# Patient Record
Sex: Male | Born: 1935 | Race: White | Hispanic: No | Marital: Married | Smoking: Never smoker
Health system: Southern US, Community
[De-identification: ages and names within clinical notes are randomized; demographics above are authoritative.]

## PROBLEM LIST (undated history)

## (undated) DIAGNOSIS — I252 Old myocardial infarction: Secondary | ICD-10-CM

## (undated) DIAGNOSIS — E785 Hyperlipidemia, unspecified: Secondary | ICD-10-CM

## (undated) DIAGNOSIS — L853 Xerosis cutis: Secondary | ICD-10-CM

## (undated) DIAGNOSIS — Z8719 Personal history of other diseases of the digestive system: Secondary | ICD-10-CM

## (undated) DIAGNOSIS — G309 Alzheimer's disease, unspecified: Secondary | ICD-10-CM

## (undated) DIAGNOSIS — F32A Depression, unspecified: Secondary | ICD-10-CM

## (undated) DIAGNOSIS — E1140 Type 2 diabetes mellitus with diabetic neuropathy, unspecified: Secondary | ICD-10-CM

## (undated) DIAGNOSIS — N35919 Unspecified urethral stricture, male, unspecified site: Secondary | ICD-10-CM

## (undated) DIAGNOSIS — I700 Atherosclerosis of aorta: Secondary | ICD-10-CM

## (undated) DIAGNOSIS — N183 Chronic kidney disease, stage 3 unspecified: Secondary | ICD-10-CM

## (undated) DIAGNOSIS — Z8546 Personal history of malignant neoplasm of prostate: Secondary | ICD-10-CM

## (undated) DIAGNOSIS — I517 Cardiomegaly: Secondary | ICD-10-CM

## (undated) DIAGNOSIS — N189 Chronic kidney disease, unspecified: Secondary | ICD-10-CM

## (undated) DIAGNOSIS — I5023 Acute on chronic systolic (congestive) heart failure: Secondary | ICD-10-CM

## (undated) DIAGNOSIS — L03119 Cellulitis of unspecified part of limb: Secondary | ICD-10-CM

## (undated) DIAGNOSIS — J449 Chronic obstructive pulmonary disease, unspecified: Secondary | ICD-10-CM

## (undated) DIAGNOSIS — IMO0001 Reserved for inherently not codable concepts without codable children: Secondary | ICD-10-CM

## (undated) DIAGNOSIS — N184 Chronic kidney disease, stage 4 (severe): Secondary | ICD-10-CM

## (undated) DIAGNOSIS — G8929 Other chronic pain: Secondary | ICD-10-CM

## (undated) DIAGNOSIS — K219 Gastro-esophageal reflux disease without esophagitis: Secondary | ICD-10-CM

## (undated) DIAGNOSIS — S2243XA Multiple fractures of ribs, bilateral, initial encounter for closed fracture: Secondary | ICD-10-CM

## (undated) DIAGNOSIS — N322 Vesical fistula, not elsewhere classified: Secondary | ICD-10-CM

## (undated) DIAGNOSIS — R269 Unspecified abnormalities of gait and mobility: Secondary | ICD-10-CM

## (undated) DIAGNOSIS — E1142 Type 2 diabetes mellitus with diabetic polyneuropathy: Secondary | ICD-10-CM

## (undated) DIAGNOSIS — F329 Major depressive disorder, single episode, unspecified: Secondary | ICD-10-CM

## (undated) DIAGNOSIS — J479 Bronchiectasis, uncomplicated: Secondary | ICD-10-CM

## (undated) DIAGNOSIS — I10 Essential (primary) hypertension: Secondary | ICD-10-CM

## (undated) DIAGNOSIS — Z8679 Personal history of other diseases of the circulatory system: Secondary | ICD-10-CM

## (undated) DIAGNOSIS — Z955 Presence of coronary angioplasty implant and graft: Secondary | ICD-10-CM

## (undated) DIAGNOSIS — R0600 Dyspnea, unspecified: Secondary | ICD-10-CM

## (undated) DIAGNOSIS — D631 Anemia in chronic kidney disease: Secondary | ICD-10-CM

## (undated) DIAGNOSIS — I6529 Occlusion and stenosis of unspecified carotid artery: Secondary | ICD-10-CM

## (undated) DIAGNOSIS — M459 Ankylosing spondylitis of unspecified sites in spine: Secondary | ICD-10-CM

## (undated) DIAGNOSIS — I255 Ischemic cardiomyopathy: Secondary | ICD-10-CM

## (undated) DIAGNOSIS — I5020 Unspecified systolic (congestive) heart failure: Secondary | ICD-10-CM

## (undated) DIAGNOSIS — Z950 Presence of cardiac pacemaker: Secondary | ICD-10-CM

## (undated) DIAGNOSIS — R32 Unspecified urinary incontinence: Secondary | ICD-10-CM

## (undated) DIAGNOSIS — R490 Dysphonia: Secondary | ICD-10-CM

## (undated) DIAGNOSIS — M1990 Unspecified osteoarthritis, unspecified site: Secondary | ICD-10-CM

## (undated) DIAGNOSIS — H9190 Unspecified hearing loss, unspecified ear: Secondary | ICD-10-CM

## (undated) DIAGNOSIS — I2510 Atherosclerotic heart disease of native coronary artery without angina pectoris: Secondary | ICD-10-CM

## (undated) DIAGNOSIS — M549 Dorsalgia, unspecified: Secondary | ICD-10-CM

## (undated) DIAGNOSIS — G5600 Carpal tunnel syndrome, unspecified upper limb: Secondary | ICD-10-CM

## (undated) DIAGNOSIS — I480 Paroxysmal atrial fibrillation: Secondary | ICD-10-CM

## (undated) DIAGNOSIS — F0280 Dementia in other diseases classified elsewhere without behavioral disturbance: Secondary | ICD-10-CM

## (undated) HISTORY — DX: Anemia in chronic kidney disease: D63.1

## (undated) HISTORY — PX: TONSILLECTOMY AND ADENOIDECTOMY: SUR1326

## (undated) HISTORY — DX: Ischemic cardiomyopathy: I25.5

## (undated) HISTORY — DX: Atherosclerosis of aorta: I70.0

## (undated) HISTORY — DX: Depression, unspecified: F32.A

## (undated) HISTORY — DX: Bronchiectasis, uncomplicated: J47.9

## (undated) HISTORY — DX: Ankylosing spondylitis of unspecified sites in spine: M45.9

## (undated) HISTORY — DX: Cellulitis of unspecified part of limb: L03.119

## (undated) HISTORY — DX: Alzheimer's disease, unspecified: G30.9

## (undated) HISTORY — DX: Cardiomegaly: I51.7

## (undated) HISTORY — DX: Major depressive disorder, single episode, unspecified: F32.9

## (undated) HISTORY — DX: Multiple fractures of ribs, bilateral, initial encounter for closed fracture: S22.43XA

## (undated) HISTORY — DX: Dyspnea, unspecified: R06.00

## (undated) HISTORY — DX: Dementia in other diseases classified elsewhere without behavioral disturbance: F02.80

## (undated) HISTORY — DX: Vesical fistula, not elsewhere classified: N32.2

## (undated) HISTORY — DX: Type 2 diabetes mellitus with diabetic neuropathy, unspecified: E11.40

## (undated) HISTORY — DX: Chronic obstructive pulmonary disease, unspecified: J44.9

## (undated) HISTORY — PX: LUMBAR DISC SURGERY: SHX700

## (undated) HISTORY — DX: Chronic kidney disease, stage 4 (severe): N18.4

## (undated) HISTORY — PX: PILONIDAL CYST EXCISION: SHX744

## (undated) HISTORY — DX: Acute on chronic systolic (congestive) heart failure: I50.23

## (undated) HISTORY — DX: Chronic kidney disease, unspecified: N18.9

## (undated) HISTORY — PX: NEUROPLASTY / TRANSPOSITION MEDIAN NERVE AT CARPAL TUNNEL BILATERAL: SUR894

## (undated) HISTORY — DX: Unspecified systolic (congestive) heart failure: I50.20

## (undated) HISTORY — DX: Carpal tunnel syndrome, unspecified upper limb: G56.00

## (undated) HISTORY — DX: Dysphonia: R49.0

---

## 1993-02-02 DIAGNOSIS — Z8546 Personal history of malignant neoplasm of prostate: Secondary | ICD-10-CM

## 1993-02-02 HISTORY — DX: Personal history of malignant neoplasm of prostate: Z85.46

## 1993-02-02 HISTORY — PX: PROSTATECTOMY: SHX69

## 1998-02-02 DIAGNOSIS — Z950 Presence of cardiac pacemaker: Secondary | ICD-10-CM

## 1998-02-02 DIAGNOSIS — I252 Old myocardial infarction: Secondary | ICD-10-CM

## 1998-02-02 HISTORY — DX: Presence of cardiac pacemaker: Z95.0

## 1998-02-02 HISTORY — DX: Old myocardial infarction: I25.2

## 1998-02-02 HISTORY — PX: CORONARY ANGIOPLASTY WITH STENT PLACEMENT: SHX49

## 2006-02-02 DIAGNOSIS — S2243XA Multiple fractures of ribs, bilateral, initial encounter for closed fracture: Secondary | ICD-10-CM

## 2006-02-02 HISTORY — DX: Multiple fractures of ribs, bilateral, initial encounter for closed fracture: S22.43XA

## 2008-02-03 DIAGNOSIS — N322 Vesical fistula, not elsewhere classified: Secondary | ICD-10-CM

## 2008-02-03 DIAGNOSIS — F0280 Dementia in other diseases classified elsewhere without behavioral disturbance: Secondary | ICD-10-CM

## 2008-02-03 HISTORY — DX: Vesical fistula, not elsewhere classified: N32.2

## 2008-02-03 HISTORY — DX: Dementia in other diseases classified elsewhere, unspecified severity, without behavioral disturbance, psychotic disturbance, mood disturbance, and anxiety: F02.80

## 2008-07-03 DEATH — deceased

## 2009-02-02 HISTORY — PX: OTHER SURGICAL HISTORY: SHX169

## 2009-09-02 DIAGNOSIS — M459 Ankylosing spondylitis of unspecified sites in spine: Secondary | ICD-10-CM

## 2009-09-02 HISTORY — DX: Ankylosing spondylitis of unspecified sites in spine: M45.9

## 2010-01-03 HISTORY — PX: TRANSTHORACIC ECHOCARDIOGRAM: SHX275

## 2010-02-02 DIAGNOSIS — G5600 Carpal tunnel syndrome, unspecified upper limb: Secondary | ICD-10-CM

## 2010-02-02 HISTORY — DX: Carpal tunnel syndrome, unspecified upper limb: G56.00

## 2011-02-03 DIAGNOSIS — N184 Chronic kidney disease, stage 4 (severe): Secondary | ICD-10-CM

## 2011-02-03 HISTORY — DX: Chronic kidney disease, stage 4 (severe): N18.4

## 2011-10-30 ENCOUNTER — Other Ambulatory Visit: Payer: Self-pay | Admitting: Urology

## 2011-11-17 ENCOUNTER — Encounter (HOSPITAL_BASED_OUTPATIENT_CLINIC_OR_DEPARTMENT_OTHER): Payer: Self-pay | Admitting: *Deleted

## 2011-11-17 NOTE — Progress Notes (Addendum)
NPO AFTER MN. ARRIVES AT 1045. NEEDS ISTAT. CURRENT EKG, LOV NOTE, ECHO,  STRESS TEST, AND PACER CHECK TO BE FAXED FROM DR Verdis Prime. CURRENT CXR TO BE FAXED FROM DR Pete Glatter. WILL TAKE METOPROLOL AND PRILOSEC AM OF SURG W/ SIP OF WATER. ALTHOUGH WEARS HEARING AIDS STILL HOH.  PT'S SON , DR Lupe Carney , WILL BE BRINGING HIM.  REVIEWED CHART W/ DR CARIGNIN MDA, W/ INFO FROM DR Katrinka Blazing,  OK TO PROCEED.

## 2011-11-19 ENCOUNTER — Encounter (HOSPITAL_BASED_OUTPATIENT_CLINIC_OR_DEPARTMENT_OTHER): Payer: Self-pay | Admitting: Anesthesiology

## 2011-11-19 ENCOUNTER — Encounter (HOSPITAL_BASED_OUTPATIENT_CLINIC_OR_DEPARTMENT_OTHER): Payer: Self-pay | Admitting: *Deleted

## 2011-11-19 ENCOUNTER — Encounter (HOSPITAL_BASED_OUTPATIENT_CLINIC_OR_DEPARTMENT_OTHER)
Admission: RE | Disposition: A | Discharge status: Home / Self Care | Payer: Self-pay | Source: Ambulatory Visit | Attending: Urology

## 2011-11-19 ENCOUNTER — Ambulatory Visit (HOSPITAL_BASED_OUTPATIENT_CLINIC_OR_DEPARTMENT_OTHER): Payer: Medicare Other | Admitting: Anesthesiology

## 2011-11-19 ENCOUNTER — Ambulatory Visit (HOSPITAL_BASED_OUTPATIENT_CLINIC_OR_DEPARTMENT_OTHER)
Admission: RE | Admit: 2011-11-19 | Discharge: 2011-11-19 | Disposition: A | Discharge status: Home / Self Care | Payer: Medicare Other | Source: Ambulatory Visit | Attending: Urology | Admitting: Urology

## 2011-11-19 DIAGNOSIS — E119 Type 2 diabetes mellitus without complications: Secondary | ICD-10-CM | POA: Insufficient documentation

## 2011-11-19 DIAGNOSIS — G4730 Sleep apnea, unspecified: Secondary | ICD-10-CM | POA: Insufficient documentation

## 2011-11-19 DIAGNOSIS — N3944 Nocturnal enuresis: Secondary | ICD-10-CM | POA: Insufficient documentation

## 2011-11-19 DIAGNOSIS — I10 Essential (primary) hypertension: Secondary | ICD-10-CM | POA: Insufficient documentation

## 2011-11-19 DIAGNOSIS — N35919 Unspecified urethral stricture, male, unspecified site: Secondary | ICD-10-CM | POA: Insufficient documentation

## 2011-11-19 DIAGNOSIS — E7800 Pure hypercholesterolemia, unspecified: Secondary | ICD-10-CM | POA: Insufficient documentation

## 2011-11-19 DIAGNOSIS — N3946 Mixed incontinence: Secondary | ICD-10-CM | POA: Insufficient documentation

## 2011-11-19 DIAGNOSIS — K219 Gastro-esophageal reflux disease without esophagitis: Secondary | ICD-10-CM | POA: Insufficient documentation

## 2011-11-19 DIAGNOSIS — Z8546 Personal history of malignant neoplasm of prostate: Secondary | ICD-10-CM | POA: Insufficient documentation

## 2011-11-19 HISTORY — DX: Unspecified osteoarthritis, unspecified site: M19.90

## 2011-11-19 HISTORY — DX: Personal history of other diseases of the circulatory system: Z86.79

## 2011-11-19 HISTORY — DX: Occlusion and stenosis of unspecified carotid artery: I65.29

## 2011-11-19 HISTORY — DX: Unspecified urethral stricture, male, unspecified site: N35.919

## 2011-11-19 HISTORY — DX: Presence of coronary angioplasty implant and graft: Z95.5

## 2011-11-19 HISTORY — DX: Unspecified urinary incontinence: R32

## 2011-11-19 HISTORY — DX: Personal history of other diseases of the digestive system: Z87.19

## 2011-11-19 HISTORY — DX: Presence of cardiac pacemaker: Z95.0

## 2011-11-19 HISTORY — DX: Hyperlipidemia, unspecified: E78.5

## 2011-11-19 HISTORY — DX: Chronic kidney disease, stage 3 unspecified: N18.30

## 2011-11-19 HISTORY — DX: Unspecified hearing loss, unspecified ear: H91.90

## 2011-11-19 HISTORY — DX: Atherosclerotic heart disease of native coronary artery without angina pectoris: I25.10

## 2011-11-19 HISTORY — DX: Other chronic pain: G89.29

## 2011-11-19 HISTORY — DX: Xerosis cutis: L85.3

## 2011-11-19 HISTORY — DX: Reserved for inherently not codable concepts without codable children: IMO0001

## 2011-11-19 HISTORY — DX: Paroxysmal atrial fibrillation: I48.0

## 2011-11-19 HISTORY — DX: Gastro-esophageal reflux disease without esophagitis: K21.9

## 2011-11-19 HISTORY — PX: CYSTOSCOPY WITH URETHRAL DILATATION: SHX5125

## 2011-11-19 HISTORY — DX: Chronic kidney disease, stage 3 (moderate): N18.3

## 2011-11-19 HISTORY — DX: Type 2 diabetes mellitus with diabetic polyneuropathy: E11.42

## 2011-11-19 HISTORY — DX: Unspecified abnormalities of gait and mobility: R26.9

## 2011-11-19 HISTORY — DX: Old myocardial infarction: I25.2

## 2011-11-19 HISTORY — DX: Essential (primary) hypertension: I10

## 2011-11-19 HISTORY — DX: Personal history of malignant neoplasm of prostate: Z85.46

## 2011-11-19 HISTORY — DX: Dorsalgia, unspecified: M54.9

## 2011-11-19 LAB — POCT I-STAT 4, (NA,K, GLUC, HGB,HCT)
Glucose, Bld: 157 mg/dL — ABNORMAL HIGH (ref 70–99)
Hemoglobin: 12.9 g/dL — ABNORMAL LOW (ref 13.0–17.0)
Potassium: 4.4 mEq/L (ref 3.5–5.1)
Sodium: 144 mEq/L (ref 135–145)

## 2011-11-19 LAB — GLUCOSE, CAPILLARY: Glucose-Capillary: 129 mg/dL — ABNORMAL HIGH (ref 70–99)

## 2011-11-19 SURGERY — CYSTOSCOPY, WITH URETHRAL DILATION
Anesthesia: General | Site: Urethra | Wound class: Clean Contaminated

## 2011-11-19 MED ORDER — LIDOCAINE HCL (CARDIAC) 20 MG/ML IV SOLN
INTRAVENOUS | Status: DC | PRN
  Administered 2011-11-19: 75 mg via INTRAVENOUS

## 2011-11-19 MED ORDER — FENTANYL CITRATE 0.05 MG/ML IJ SOLN: 25.0000 ug | INTRAMUSCULAR | Status: DC | PRN

## 2011-11-19 MED ORDER — FENTANYL CITRATE 0.05 MG/ML IJ SOLN
INTRAMUSCULAR | Status: DC | PRN
  Administered 2011-11-19 (×4): 25 ug via INTRAVENOUS

## 2011-11-19 MED ORDER — IOHEXOL 350 MG/ML SOLN
INTRAVENOUS | Status: DC | PRN
  Administered 2011-11-19: 35 mL

## 2011-11-19 MED ORDER — LACTATED RINGERS IV SOLN
INTRAVENOUS | Status: DC
  Administered 2011-11-19: 100 mL/h via INTRAVENOUS
  Administered 2011-11-19: 11:00:00 via INTRAVENOUS

## 2011-11-19 MED ORDER — STERILE WATER FOR IRRIGATION IR SOLN
Status: DC | PRN
  Administered 2011-11-19: 3000 mL

## 2011-11-19 MED ORDER — CIPROFLOXACIN IN D5W 400 MG/200ML IV SOLN
400.0000 mg | INTRAVENOUS | Status: AC
  Administered 2011-11-19: 400 mg via INTRAVENOUS

## 2011-11-19 MED ORDER — PROMETHAZINE HCL 25 MG/ML IJ SOLN: 6.2500 mg | INTRAMUSCULAR | Status: DC | PRN

## 2011-11-19 MED ORDER — PROPOFOL 10 MG/ML IV BOLUS
INTRAVENOUS | Status: DC | PRN
  Administered 2011-11-19: 150 mg via INTRAVENOUS

## 2011-11-19 MED ORDER — CIPROFLOXACIN HCL 250 MG PO TABS: 250.0000 mg | ORAL_TABLET | Freq: Two times a day (BID) | ORAL | Status: DC

## 2011-11-19 MED ORDER — HYDROCODONE-ACETAMINOPHEN 5-500 MG PO TABS: 1.0000 | ORAL_TABLET | Freq: Four times a day (QID) | ORAL | Status: DC | PRN

## 2011-11-19 MED ORDER — LACTATED RINGERS IV SOLN: INTRAVENOUS | Status: DC

## 2011-11-19 SURGICAL SUPPLY — 24 items
ADAPTER CATH URET PLST 4-6FR (CATHETERS) IMPLANT
BAG DRAIN URO-CYSTO SKYTR STRL (DRAIN) ×2 IMPLANT
BALLN NEPHROSTOMY (BALLOONS) ×2
BALLOON NEPHROSTOMY (BALLOONS) ×1 IMPLANT
CANISTER SUCT LVC 12 LTR MEDI- (MISCELLANEOUS) ×2 IMPLANT
CATH ROBINSON RED A/P 14FR (CATHETERS) ×4 IMPLANT
CATH URET 5FR 28IN CONE TIP (BALLOONS)
CATH URET 5FR 70CM CONE TIP (BALLOONS) IMPLANT
CLOTH BEACON ORANGE TIMEOUT ST (SAFETY) ×2 IMPLANT
DRAPE CAMERA CLOSED 9X96 (DRAPES) ×2 IMPLANT
GLOVE BIO SURGEON STRL SZ7.5 (GLOVE) ×2 IMPLANT
GLOVE ECLIPSE 7.0 STRL STRAW (GLOVE) ×2 IMPLANT
GLOVE INDICATOR 7.0 STRL GRN (GLOVE) ×2 IMPLANT
GLOVE SKINSENSE NS SZ7.0 (GLOVE) ×1
GLOVE SKINSENSE STRL SZ7.0 (GLOVE) ×1 IMPLANT
GOWN PREVENTION PLUS LG XLONG (DISPOSABLE) ×2 IMPLANT
GOWN STRL NON-REIN LRG LVL3 (GOWN DISPOSABLE) ×2 IMPLANT
GOWN STRL REIN XL XLG (GOWN DISPOSABLE) ×2 IMPLANT
GUIDEWIRE STR DUAL SENSOR (WIRE) ×2 IMPLANT
NS IRRIG 500ML POUR BTL (IV SOLUTION) IMPLANT
PACK CYSTOSCOPY (CUSTOM PROCEDURE TRAY) ×2 IMPLANT
SYRINGE 10CC LL (SYRINGE) ×2 IMPLANT
SYRINGE IRR TOOMEY STRL 70CC (SYRINGE) ×2 IMPLANT
WATER STERILE IRR 3000ML UROMA (IV SOLUTION) ×2 IMPLANT

## 2011-11-19 NOTE — Transfer of Care (Signed)
Immediate Anesthesia Transfer of Care Note  Patient: Joseph Hobbs  Procedure(s) Performed: Procedure(s) (LRB): CYSTOSCOPY WITH URETHRAL DILATATION (N/A) URETHROGRAM (N/A)  Patient Location: Patient transported to PACU with oxygen via face mask at 4 Liters / Min  Anesthesia Type: General  Level of Consciousness: awake and alert   Airway & Oxygen Therapy: Patient Spontanous Breathing and Patient connected to face mask oxygen  Post-op Assessment: Report given to PACU RN and Post -op Vital signs reviewed and stable  Post vital signs: Reviewed and stable  Dentition: Teeth and oropharynx remain in pre-op condition  Complications: No apparent anesthesia complications

## 2011-11-19 NOTE — Op Note (Signed)
Preoperative diagnosis: Urethral stricture  Postoperative diagnosis: Urethral stricture Surgery: Cystoscopy retrograde urethrogram and balloon dilation of stricture Surgeon: Dr. Lorin Picket Joseph Hobbs  The patient has incontinence and the above diagnoses and consented to the above procedure  Preoperative antibiotics were given.  17 [place] scope was utilized. He a 4 French stricture in the distal bulbar urethra.  Retrograde urethrogram: With my usual technique with a 6 French ureteral catheter cut off 2 inches in length and did a retrograde urethrogram in the AP view. Hardcopy was taken. He had normal caliber distally and proximally with dye reach in the bladder. Goals these were seen. He at least a 1 cm of more like 2 cm urethral stricture   Sensor wire was passed under fluoroscopic guidance using the bladder. Balloon dilation catheter was inserted the appropriate depth. I balloon dilated to 18 atmospheres of pressure for 5 minutes. Bladder was deflated. Balloon was removed. I cystoscoped with a 27 [place] scope. Penile urethra was normal. In the distal bulbar urethra he had a 2 cm and possibly 3 cm with stricture. It was spongiose fibrosis associated with it along its length. Urethral sphincter looked reasonable. He had heavy trabeculation of the trigone of the bladder. There is no stitch or foreign body or carcinoma. I did not see obvious evidence of his fistula repair the one could argue there was a scar in his bladder  Patient was taken to recover room without a catheter

## 2011-11-19 NOTE — Anesthesia Procedure Notes (Signed)
Procedure Name: LMA Insertion Date/Time: 11/19/2011 12:16 PM Performed by: Fran Lowes Pre-anesthesia Checklist: Patient identified, Emergency Drugs available, Suction available and Patient being monitored Patient Re-evaluated:Patient Re-evaluated prior to inductionOxygen Delivery Method: Circle System Utilized Preoxygenation: Pre-oxygenation with 100% oxygen Intubation Type: IV induction Ventilation: Mask ventilation without difficulty LMA: LMA inserted LMA Size: 4.0 Number of attempts: 1 Airway Equipment and Method: bite block Placement Confirmation: positive ETCO2 Tube secured with: Tape Dental Injury: Teeth and Oropharynx as per pre-operative assessment

## 2011-11-19 NOTE — Interval H&P Note (Signed)
History and Physical Interval Note:  11/19/2011 1:02 PM  Joseph Hobbs  has presented today for surgery, with the diagnosis of URETHRAL STRICTURE  The various methods of treatment have been discussed with the patient and family. After consideration of risks, benefits and other options for treatment, the patient has consented to  Procedure(s) (LRB) with comments: CYSTOSCOPY WITH URETHRAL DILATATION (N/A) - CYSTO WITH BALLOON DILATION URETHROGRAM (N/A) - RETROGRADE URETHROGRAM C-ARM PT HAS PACEMAKER AND IS DIABETIC as a surgical intervention .  The patient's history has been reviewed, patient examined, no change in status, stable for surgery.  I have reviewed the patient's chart and labs.  Questions were answered to the patient's satisfaction.     [name] A

## 2011-11-19 NOTE — H&P (Signed)
History of Present Illness   I was consulted by Dr. 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 He had a radical prostatectomy in 1996. He used to use a few pads a day and I think they were mildly wet.  He now leaks with coughing and sneezing but not bending and lifting. He has urge incontinence. I believe he leaks without awareness. He has enuresis, though it is not severe by history. He wears 3-4 pad systems per day with double padding and he is soaking. He does have a walker.   He voids every 2-3 hours and gets up 3 times a night. He has a slow dribbling flow. He does not have hesitancy. He does not strain. He cannot tell when he is finished.   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   He denies a history of kidney stones and he was recently given ciprofloxacin and another antibiotic by Dr. Pete Hobbs but does not report a lot of urinary tract infections.   He has had 3 lower back surgeries. He is on oral hypoglycemics. His bowel function is normal. His incontinence has not been medically treated. He recently moved from [place]. The symptoms are significant in severity and persisting.   He reports that there was difficulty passing a catheter when he had his bladder fistula ____    Past Medical History Problems  1. History of  Adult Sleep Apnea 780.57 2. History of  Anxiety (Symptom) 300.00 3. History of  Arthritis V13.4 4. History of  Atrial Fibrillation 427.31 5. History of  Cardiac Arrest 427.5 6. History of  Cardiac Cath Indications: Cardiac Arrhythmia 7. History of  Diabetes Mellitus 250.00 8. History of  Esophageal Reflux 530.81 9. History of  Heart Disease 429.9 10. History of  Hypercholesterolemia 272.0 11. History of  Hypertension 401.9 12. History of  Murmurs 785.2 13. History of  Prostate Cancer V10.46  Surgical History Problems  1. History of   Cataract Surgery 2. History of  Cholecystectomy 3. History of  Hand Surgery 4. History of  Pilonidal Cyst Resection - Simple 5. History of  Prostate Surgery  Family History Problems  1. Paternal history of  Death In The Family Father father passed @ age 75stroke 2. Maternal history of  Death In The Family Mother mother passed @ age 32old age 24. Family history of  Family Health Status Number Of Children 1 son1 daughter 4. 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  Genitourinary: incontinence.  Gastrointestinal: heartburn.  Constitutional: feeling tired (fatigue).  Musculoskeletal: back pain and joint pain.  Psychiatric: anxiety.    Vitals Vital Signs [Data Includes: Last 1 Day]  26Sep2013 01:17PM  BMI Calculated: 27.92 BSA Calculated: 2.07 Height: 5 ft 10 in Weight: 195 lb  Blood Pressure: 131 / 61 Temperature: 97.8 F Heart Rate: 72  Physical Exam Constitutional: Well nourished and well developed . No acute distress.  ENT:. The ears and nose are normal in appearance.  Neck: The appearance of the neck is normal and no neck mass is present.  Pulmonary: No respiratory distress and normal respiratory rhythm and effort.  Cardiovascular: Heart rate and rhythm are normal . No peripheral edema.  Abdomen: The abdomen is soft and nontender. No masses are palpated. No CVA tenderness. No hernias are palpable. No hepatosplenomegaly noted.  Lymphatics: The femoral and inguinal nodes are not enlarged or tender.  Skin: Normal skin turgor, no visible rash and no visible skin lesions.  Neuro/Psych:.  Mood and affect are appropriate.   . Genitourinary: On physical examination Dr. Clovis Hobbs had normal male genitalia and negative cough test standing. He denies any local recurrence on digital erectile examination.    Results/Data   Today he underwent a number of tests which I personally reviewed. Urinalysis: I reviewed, negative.   Bladder scan: Bladder scan residual was 0.0 mL.   Cystoscopy: Joseph Hobbs underwent cystoscopy after consent. A flexible cystoscope well lubricated was utilized. Penile urethra was normal. In the proximal bulbar urethra away from the membranous urethra he had approximately a 4 French pinhole stricture. There was no associated spongiofibrosis associated with it. Urine [Data Includes: Last 1 Day]   26Sep2013  COLOR YELLOW   APPEARANCE CLEAR   SPECIFIC GRAVITY 1.015   pH 6.0   GLUCOSE NEG mg/dL  BILIRUBIN NEG   KETONE NEG mg/dL  BLOOD NEG   PROTEIN NEG mg/dL  UROBILINOGEN 0.2 mg/dL  NITRITE NEG   LEUKOCYTE ESTERASE NEG    Assessment Assessed  1. Urge And Stress Incontinence 788.33 2. Enuresis Nocturnal 788.36  Plan Health Maintenance (V70.0)  1. UA With REFLEX  Done: 26Sep2013 01:28PM Urinary Incontinence (788.30)  2. 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 He has had a radical prostatectomy. He has urge incontinence, leakage without awareness and enuresis. He has had some issues with his gait and there has also been documented memory issues and he has a peripheral neuropathy. He is likely developing an overactive bladder. He has mild frequency and mild to moderate nocturia and a decreased flow. He may have had a bladder neck contracture in the past. My goal is to assess him with urodynamics and cystoscopy and proceed accordingly.  I am going to send a copy of my note to Dr. Merlene Hobbs to keep him updated on his treatment course.  CC: Joseph Laughter, MD   I drew Dr. Clovis Hobbs a picture. He understands that he had a stricture likely the cause of his slow flow. I think it is very reasonable to do a cystoscopy retrograde urethrogram and balloon dilation and see if this helps some of his symptoms. Likely it is not going to help his incontinence but I think it would help his flow. Eventually we would order urodynamics and proceed accordingly. He certainly has a complicated presentation with risk factors for stress incontinence and overactive and/or neurogenic bladder. I discussed a balloon dilation.  We talked about balloon dilation in detail. Pros, cons, general surgical and anesthetic risks, and other options including watchful waiting were discussed. He understands that dilation is generally successful in most cases but long-term success rates are low. We talked about the risk of persistent, de novo, or worsening incontinence and voiding dysfunction. Risks were described but not limited to the discussion of injury to neighboring structures and soft tissues. Bleeding, infection, pain, erectile dysfunction, and spraying of urination were discussed. The risk of neuropathy was discussed as well as the usual post-operative course.   After a thorough  review of the management options for the patient's condition the patient  elected to proceed with surgical therapy as noted above. We have discussed the potential benefits and risks of the procedure, side effects of the proposed treatment, the likelihood of the patient achieving the goals of the procedure, and any potential problems that might occur during the procedure or recuperation. Informed consent has been obtained.

## 2011-11-19 NOTE — Anesthesia Preprocedure Evaluation (Addendum)
Anesthesia Evaluation  Patient identified by MRN, date of birth, ID band Patient awake    Reviewed: Allergy & Precautions, H&P , NPO status , Patient's Chart, lab work & pertinent test results  Airway Mallampati: II TM Distance: >3 FB Neck ROM: Full  Mouth opening: Limited Mouth Opening  Dental  (+) Teeth Intact and Dental Advisory Given   Pulmonary neg pulmonary ROS,  breath sounds clear to auscultation  Pulmonary exam normal       Cardiovascular hypertension, Pt. on medications and Pt. on home beta blockers + CAD, + Past MI, + Cardiac Stents (Cardiac stents X 3 post MI 2000) and +CHF + dysrhythmias Atrial Fibrillation + pacemaker Rhythm:Regular Rate:Normal  2D ECHO: Severe concentric LVH, EF 35-40%,mild-mod. MR,   Neuro/Psych Dementia  Neuromuscular disease negative psych ROS   GI/Hepatic Neg liver ROS, GERD-  Medicated,  Endo/Other  diabetes, Type 2, Oral Hypoglycemic Agents  Renal/GU Renal InsufficiencyRenal disease  negative genitourinary   Musculoskeletal negative musculoskeletal ROS (+)   Abdominal   Peds  Hematology negative hematology ROS (+)   Anesthesia Other Findings   Reproductive/Obstetrics negative OB ROS                         Anesthesia Physical Anesthesia Plan  ASA: III  Anesthesia Plan: General   Post-op Pain Management:    Induction: Intravenous  Airway Management Planned: LMA  Additional Equipment:   Intra-op Plan:   Post-operative Plan: Extubation in OR  Informed Consent: I have reviewed the patients History and Physical, chart, labs and discussed the procedure including the risks, benefits and alternatives for the proposed anesthesia with the patient or authorized representative who has indicated his/her understanding and acceptance.   Dental advisory given  Plan Discussed with: CRNA  Anesthesia Plan Comments:         Anesthesia Quick Evaluation

## 2011-11-19 NOTE — Anesthesia Postprocedure Evaluation (Signed)
Anesthesia Post Note  Patient: Joseph Hobbs  Procedure(s) Performed: Procedure(s) (LRB): CYSTOSCOPY WITH URETHRAL DILATATION (N/A) URETHROGRAM (N/A)  Anesthesia type: General  Patient location: PACU  Post pain: Pain level controlled  Post assessment: Post-op Vital signs reviewed  Last Vitals:  Filed Vitals:   11/19/11 1330  BP: 147/64  Pulse: 64  Temp:   Resp: 14    Post vital signs: Reviewed  Level of consciousness: sedated  Complications: No apparent anesthesia complications

## 2011-11-20 ENCOUNTER — Encounter (HOSPITAL_BASED_OUTPATIENT_CLINIC_OR_DEPARTMENT_OTHER): Payer: Self-pay | Admitting: Urology

## 2012-12-06 ENCOUNTER — Encounter: Payer: Self-pay | Admitting: Interventional Cardiology

## 2012-12-06 DIAGNOSIS — I2510 Atherosclerotic heart disease of native coronary artery without angina pectoris: Secondary | ICD-10-CM | POA: Insufficient documentation

## 2012-12-06 DIAGNOSIS — N35919 Unspecified urethral stricture, male, unspecified site: Secondary | ICD-10-CM | POA: Insufficient documentation

## 2012-12-06 DIAGNOSIS — G8929 Other chronic pain: Secondary | ICD-10-CM | POA: Insufficient documentation

## 2012-12-06 DIAGNOSIS — E1142 Type 2 diabetes mellitus with diabetic polyneuropathy: Secondary | ICD-10-CM | POA: Insufficient documentation

## 2012-12-06 DIAGNOSIS — I480 Paroxysmal atrial fibrillation: Secondary | ICD-10-CM | POA: Insufficient documentation

## 2012-12-06 DIAGNOSIS — M549 Dorsalgia, unspecified: Secondary | ICD-10-CM

## 2012-12-06 DIAGNOSIS — E785 Hyperlipidemia, unspecified: Secondary | ICD-10-CM | POA: Insufficient documentation

## 2012-12-06 DIAGNOSIS — R269 Unspecified abnormalities of gait and mobility: Secondary | ICD-10-CM | POA: Insufficient documentation

## 2012-12-06 DIAGNOSIS — M1990 Unspecified osteoarthritis, unspecified site: Secondary | ICD-10-CM | POA: Insufficient documentation

## 2012-12-06 DIAGNOSIS — Z8719 Personal history of other diseases of the digestive system: Secondary | ICD-10-CM | POA: Insufficient documentation

## 2012-12-06 DIAGNOSIS — E119 Type 2 diabetes mellitus without complications: Secondary | ICD-10-CM | POA: Insufficient documentation

## 2012-12-06 DIAGNOSIS — K219 Gastro-esophageal reflux disease without esophagitis: Secondary | ICD-10-CM | POA: Insufficient documentation

## 2012-12-07 ENCOUNTER — Ambulatory Visit (INDEPENDENT_AMBULATORY_CARE_PROVIDER_SITE_OTHER): Payer: Medicare Other | Admitting: Interventional Cardiology

## 2012-12-07 ENCOUNTER — Encounter: Payer: Self-pay | Admitting: Interventional Cardiology

## 2012-12-07 VITALS — BP 120/60 | HR 60 | Ht 73.0 in | Wt 202.0 lb

## 2012-12-07 DIAGNOSIS — I480 Paroxysmal atrial fibrillation: Secondary | ICD-10-CM

## 2012-12-07 DIAGNOSIS — I5022 Chronic systolic (congestive) heart failure: Secondary | ICD-10-CM

## 2012-12-07 DIAGNOSIS — I4891 Unspecified atrial fibrillation: Secondary | ICD-10-CM

## 2012-12-07 DIAGNOSIS — Z950 Presence of cardiac pacemaker: Secondary | ICD-10-CM

## 2012-12-07 DIAGNOSIS — N184 Chronic kidney disease, stage 4 (severe): Secondary | ICD-10-CM | POA: Insufficient documentation

## 2012-12-07 DIAGNOSIS — I2510 Atherosclerotic heart disease of native coronary artery without angina pectoris: Secondary | ICD-10-CM

## 2012-12-07 NOTE — Patient Instructions (Signed)
  Your physician recommends that you continue on your current medications as directed. Please refer to the Current Medication list given to you today.  Your physician wants you to follow-up in: 4 months You will receive a reminder letter in the mail two months in advance. If you don't receive a letter, please call our office to schedule the follow-up appointment.  Follow a low sodium diet and try to stay active

## 2012-12-07 NOTE — Progress Notes (Signed)
Patient ID: Joseph Hobbs, male   DOB: 16-Apr-1924, 79 y.o.   MRN: [id number]    [address], [address] [place], [place]  [address] Phone: [phone] Fax:  [phone]  Date:  12/07/2012   ID:  Bernerd Limbo, DOB 1924-02-15, MRN [id number]  PCP:  No primary provider on file.   ASSESSMENT:  1. Chronic systolic heart failure, compensated  2. Hypertension, controlled 3. Chronic kidney disease, stage IV 4. Coronary artery disease without angina 5. Sinus rhythm with atrial tracking and V. Pacing   PLAN:  1. no change in the current medical regimen. Continue diuretic regimen as currently ordered 2. Call if lower extremity swelling or increased dyspnea 3. Clinic followup 4 months   SUBJECTIVE: Joseph Hobbs is a 79 y.o. male is still limited by exertional dyspnea. No significant change since being seen in September. Lower extremity edema has resolved. He denies orthopnea. He has not had angina.   Wt Readings from Last 3 Encounters:  12/07/12 202 lb (91.627 kg)  11/19/11 196 lb (88.905 kg)  11/19/11 196 lb (88.905 kg)     Past Medical History  Diagnosis Date  . Hypertension   . History of acute myocardial infarction 2000--  S/P STENTS X3  . S/P coronary artery stent placement   . History of CHF (congestive heart failure) POST SURGERY 2011    EF 35-40%  . PAF (paroxysmal atrial fibrillation) POST OP SURGERY 2011  . Hyperlipidemia   . Gait disorder USES WALKER  . Diabetic peripheral neuropathy   . DM (diabetes mellitus), type 2   . GERD (gastroesophageal reflux disease)   . History of esophageal stricture POST DILATION'S  . Urethral stricture   . Urinary incontinence   . Chronic kidney disease (CKD), stage III (moderate)   . History of prostate cancer 1995-- S/P PROSTATECTOMY    NO RECURRENCE  . Arthritis   . Impaired hearing   . Dry skin AT TIMES GETS LESIONS--  CURRENTLY THEY ARE HEALED  . Chronic back pain   . Asymptomatic carotid artery stenosis BILATERAL ICA    -- PER DR Vaanya Shambaugh NOTE   . Pacemaker PLACEMENT IN 2000 --  LAST PACER CHECK  07-08-2011  . Coronary artery disease CARDIOLOGIST- DR Verdis Prime-- LOV  06-08-2011--  CURRENT EKG, LAST PACER CHECK , ECHO W/ CHART (NO STRESS TEST )  . Impaired memory DX ALZHEIMER'S IN 2010    Current Outpatient Prescriptions on File Prior to Visit  Medication Sig Dispense Refill  . ALPRAZolam (XANAX) 0.25 MG tablet Take 0.25 mg by mouth at bedtime as needed.      . clopidogrel (PLAVIX) 75 MG tablet Take 75 mg by mouth daily.      Marland Kitchen HYDROcodone-acetaminophen (NORCO/VICODIN) 5-325 MG per tablet Take 1 tablet by mouth every 6 (six) hours as needed.      Marland Kitchen HYDROcodone-acetaminophen (VICODIN) 5-500 MG per tablet Take 1-2 tablets by mouth every 6 (six) hours as needed for pain.  2 tablet  0  . isosorbide-hydrALAZINE (BIDIL) 20-37.5 MG per tablet Take 1 tablet by mouth 2 (two) times daily.       Marland Kitchen LORATADINE PO Take 20 mg by mouth daily.      . metoprolol (LOPRESSOR) 50 MG tablet Take 50 mg by mouth 2 (two) times daily.      . nitroGLYCERIN (NITROSTAT) 0.4 MG SL tablet Place 0.4 mg under the tongue every 5 (five) minutes as needed.      Marland Kitchen omeprazole (PRILOSEC) 20 MG  capsule Take 20 mg by mouth every morning.      . sertraline (ZOLOFT) 100 MG tablet Take 100 mg by mouth daily.      . sitaGLIPtin (JANUVIA) 50 MG tablet Take 50 mg by mouth daily.      Marland Kitchen zolpidem (AMBIEN) 10 MG tablet Take 10 mg by mouth at bedtime as needed.       No current facility-administered medications on file prior to visit.   Furosemide 40 mg daily  Allergies:    Allergies  Allergen Reactions  . Penicillins Other (See Comments)    UNKNOWN    Social History:  The patient  reports that he has never smoked. He has never used smokeless tobacco. He reports that he does not drink alcohol or use illicit drugs.   ROS:  Please see the history of present illness.    No complaints other than chronic aches and pains   All other systems reviewed and  negative.   OBJECTIVE: VS:  BP 120/60  Pulse 60  Ht 6\' 1"  (1.854 m)  Wt 202 lb (91.627 kg)  BMI 26.66 kg/m2 Well nourished, well developed, in no acute distress, appears his stated age  HEENT: normal Neck: JVD moderate with C. V waves noted even while sitting. Carotid bruit absent  Cardiac:  normal S1, S2; RRR; no murmur Lungs:  clear to auscultation bilaterally, no wheezing, rhonchi or rales Abd: soft, nontender, no hepatomegaly Ext: Edema trace ankle. Pulses thready 1+ bilateral  Skin: warm and dry Neuro:  CNs 2-12 intact, no focal abnormalities noted  EKG:  Atrial tracking occasional atrial pacing with ventricular pacing.       Signed, Darci Needle III, MD 12/07/2012 10:49 AM

## 2013-02-02 DIAGNOSIS — D631 Anemia in chronic kidney disease: Secondary | ICD-10-CM

## 2013-02-02 DIAGNOSIS — R490 Dysphonia: Secondary | ICD-10-CM

## 2013-02-02 DIAGNOSIS — N189 Chronic kidney disease, unspecified: Secondary | ICD-10-CM

## 2013-02-02 DIAGNOSIS — I700 Atherosclerosis of aorta: Secondary | ICD-10-CM

## 2013-02-02 HISTORY — DX: Dysphonia: R49.0

## 2013-02-02 HISTORY — DX: Anemia in chronic kidney disease: D63.1

## 2013-02-02 HISTORY — DX: Atherosclerosis of aorta: I70.0

## 2013-02-02 HISTORY — DX: Chronic kidney disease, unspecified: N18.9

## 2013-03-03 ENCOUNTER — Other Ambulatory Visit: Payer: Self-pay | Admitting: MOHS-Micrographic Surgery

## 2013-03-03 DIAGNOSIS — IMO0002 Reserved for concepts with insufficient information to code with codable children: Secondary | ICD-10-CM

## 2013-03-06 ENCOUNTER — Ambulatory Visit
Admission: RE | Admit: 2013-03-06 | Discharge: 2013-03-06 | Disposition: A | Discharge status: Home / Self Care | Payer: Medicare Other | Source: Ambulatory Visit | Attending: MOHS-Micrographic Surgery | Admitting: MOHS-Micrographic Surgery

## 2013-03-06 DIAGNOSIS — IMO0002 Reserved for concepts with insufficient information to code with codable children: Secondary | ICD-10-CM

## 2013-03-09 ENCOUNTER — Encounter: Payer: Medicare Other | Admitting: Internal Medicine

## 2013-03-22 ENCOUNTER — Encounter: Payer: Self-pay | Admitting: Cardiology

## 2013-03-22 ENCOUNTER — Ambulatory Visit (INDEPENDENT_AMBULATORY_CARE_PROVIDER_SITE_OTHER): Payer: Medicare Other | Admitting: Cardiology

## 2013-03-22 VITALS — BP 108/61 | HR 65 | Ht 73.0 in | Wt 191.0 lb

## 2013-03-22 DIAGNOSIS — I4891 Unspecified atrial fibrillation: Secondary | ICD-10-CM

## 2013-03-22 DIAGNOSIS — I5022 Chronic systolic (congestive) heart failure: Secondary | ICD-10-CM

## 2013-03-22 DIAGNOSIS — Z950 Presence of cardiac pacemaker: Secondary | ICD-10-CM

## 2013-03-22 DIAGNOSIS — I495 Sick sinus syndrome: Secondary | ICD-10-CM

## 2013-03-22 LAB — MDC_IDC_ENUM_SESS_TYPE_INCLINIC
Brady Statistic RA Percent Paced: 93 %
Brady Statistic RV Percent Paced: 99 %
Lead Channel Impedance Value: 410 Ohm
Lead Channel Impedance Value: 450 Ohm
Lead Channel Pacing Threshold Pulse Width: 0.4 ms
Lead Channel Pacing Threshold Pulse Width: 0.4 ms
Lead Channel Sensing Intrinsic Amplitude: 1 mV
Lead Channel Sensing Intrinsic Amplitude: 9.5 mV
Lead Channel Setting Pacing Amplitude: 2.5 V
Lead Channel Setting Pacing Pulse Width: 0.4 ms
Lead Channel Setting Sensing Sensitivity: 2 mV
MDC IDC MSMT BATTERY REMAINING LONGEVITY: 78 mo
MDC IDC MSMT BATTERY VOLTAGE: 2.93 V
MDC IDC MSMT LEADCHNL RA PACING THRESHOLD AMPLITUDE: 1.25 V
MDC IDC MSMT LEADCHNL RV PACING THRESHOLD AMPLITUDE: 0.75 V
MDC IDC PG SERIAL: 7125769
MDC IDC SESS DTM: 20150218153304
MDC IDC SET LEADCHNL RV PACING AMPLITUDE: 1 V

## 2013-03-22 NOTE — Patient Instructions (Signed)
Your physician wants you to follow-up in: 6 MONTHS WITH DR. Logan BoresKLEIN You will receive a reminder letter in the mail two months in advance. If you don't receive a letter, please call our office to schedule the follow-up appointment.  Your physician recommends that you continue on your current medications as directed. Please refer to the Current Medication list given to you today.  KEEP APPOINTMENT WITH DR. Katrinka BlazingSMITH May 03, 2013

## 2013-03-22 NOTE — Progress Notes (Signed)
ELECTROPHYSIOLOGY OFFICE NOTE   Patient ID: Joseph Hobbs MRN: [id number], DOB/AGE: January 17, 1925   Date of Visit: 03/22/2013  Primary Cardiologist: Verdis Prime, MD Reason for Visit: EP/device follow-up  History of Present Illness  Joseph Hobbs is a 80 y.o. male with an ischemic CM, EF 35-40%, sinus node dysfunction s/p PPM implant [facility], [place]), post operative atrial fibrillation in 2011, CAD, HTN, DM and CKD who presents today for routine electrophysiology/device followup. His PPM has been previously followed by Dr. Katrinka Blazing while at [facility]. Today, he reports he is in his usual state of health. He has frequent falls but this is not new for him. He ambulates with a rolling walker which helps. 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 He has chronic DOE but states this has been stable and is no worse than his baseline. He has no complaints. He denies chest pain. He denies palpitations, dizziness, near syncope or syncope. He denies LE swelling, orthopnea or PND. He is compliant with medications.  Past Medical History Past Medical History  Diagnosis Date  . Hypertension   . History of acute myocardial infarction 2000--  S/P STENTS X3  . S/P coronary artery stent placement   . History of CHF (congestive heart failure) POST SURGERY 2011    EF 35-40%  . PAF (paroxysmal atrial fibrillation) POST OP SURGERY 2011  . Hyperlipidemia   . Gait disorder USES WALKER  . Diabetic peripheral neuropathy   . DM (diabetes mellitus), type 2   . GERD (gastroesophageal reflux disease)   . History of esophageal stricture POST DILATION'S  . Urethral stricture   . Urinary incontinence   . Chronic kidney disease (CKD), stage III (moderate)   . History of prostate cancer 1995-- S/P PROSTATECTOMY    NO RECURRENCE  . Arthritis   . Impaired hearing   . Dry skin AT TIMES GETS LESIONS--  CURRENTLY THEY ARE HEALED  . Chronic back pain   . Asymptomatic carotid artery stenosis  BILATERAL ICA   -- PER DR SMITH NOTE   . Pacemaker PLACEMENT IN 2000 --  LAST PACER CHECK  07-08-2011  . Coronary artery disease CARDIOLOGIST- DR Verdis Prime-- LOV  06-08-2011--  CURRENT EKG, LAST PACER CHECK , ECHO W/ CHART (NO STRESS TEST )  . Impaired memory DX ALZHEIMER'S IN 2010    Past Surgical History Past Surgical History  Procedure Laterality Date  . Prostatectomy  1995    PROSTATE CANCER  . Lumbar disc surgery  X3   LAST ONE 1990  . Coronary angioplasty with stent placement  2000    [place]  [place]  . Neuroplasty / transposition median nerve at carpal tunnel bilateral    . Pilonidal cyst excision    . Tonsillectomy and adenoidectomy    . Partial colectomy/ repair fissure  2011    DIVERTICULITIS  . Transthoracic echocardiogram  01-03-2010  DR Verdis Prime    SEVERE CONCENTRIC LVH/ EF 35-40%/ SEVERE LEFT ATRIAL ENLARGEMENT/ MODERATE MITRAL VALVE REGURG./ GRADE IID DIASTOLIC DYSFUNCTION/ MILD AORTIC  &  TRICUSPID VALVE REGURG  . Cystoscopy with urethral dilatation  11/19/2011    Procedure: CYSTOSCOPY WITH URETHRAL DILATATION;  Surgeon: Martina Sinner, MD;  Location: [facility] [place];  Service: Urology;  Laterality: N/A;  CYSTO WITH BALLOON DILATION    Allergies/Intolerances Allergies  Allergen Reactions  . Penicillins Other (See Comments)    UNKNOWN    Current Home Medications Current Outpatient Prescriptions  Medication Sig  Dispense Refill  . ALPRAZolam (XANAX) 0.25 MG tablet Take 0.25 mg by mouth at bedtime as needed.      Marland Kitchen aspirin 81 MG tablet Take 81 mg by mouth daily.      . cetaphil (CETAPHIL) cream Apply topically daily.      . furosemide (LASIX) 40 MG tablet Take 40 mg by mouth daily.      Marland Kitchen HYDROcodone-acetaminophen (NORCO/VICODIN) 5-325 MG per tablet Take 1 tablet by mouth every 6 (six) hours as needed.      . isosorbide-hydrALAZINE (BIDIL) 20-37.5 MG per tablet Take 1 tablet by mouth 2 (two) times daily.       Marland Kitchen LORATADINE PO Take  20 mg by mouth daily.      . metoprolol (LOPRESSOR) 50 MG tablet Take 50 mg by mouth 2 (two) times daily.      . mirtazapine (REMERON) 7.5 MG tablet Take 7.5 mg by mouth at bedtime.      Marland Kitchen NAMENDA 5 MG tablet Take 5 mg by mouth 2 (two) times daily.       . nitroGLYCERIN (NITROSTAT) 0.4 MG SL tablet Place 0.4 mg under the tongue every 5 (five) minutes as needed.      Marland Kitchen omeprazole (PRILOSEC) 20 MG capsule Take 20 mg by mouth every morning.      Marland Kitchen oxybutynin (DITROPAN-XL) 5 MG 24 hr tablet Take 5 mg by mouth every morning. Overactive bladder      . rivastigmine (EXELON) 9.5 mg/24hr Place 9.5 mg onto the skin daily.      . sertraline (ZOLOFT) 100 MG tablet Take 100 mg by mouth daily.      . sitaGLIPtin (JANUVIA) 50 MG tablet Take 50 mg by mouth daily.      . sodium chloride (OCEAN) 0.65 % SOLN nasal spray Place 1 spray into both nostrils as needed for congestion.      . triamcinolone (KENALOG) 0.025 % cream       . zolpidem (AMBIEN) 10 MG tablet Take 10 mg by mouth at bedtime as needed.       No current facility-administered medications for this visit.    Social History History   Social History  . Marital Status: Married    Spouse Name: N/A    Number of Children: N/A  . Years of Education: N/A   Occupational History  . Not on file.   Social History Main Topics  . Smoking status: Never Smoker   . Smokeless tobacco: Never Used  . Alcohol Use: No  . Drug Use: No  . Sexual Activity:    Other Topics Concern  . Not on file   Social History Narrative  . No narrative on file     Review of Systems General: No chills, fever, night sweats or weight changes Cardiovascular: No chest pain, dyspnea on exertion, edema, orthopnea, palpitations, paroxysmal nocturnal dyspnea Dermatological: No rash, lesions or masses Respiratory: No cough, dyspnea Urologic: No hematuria, dysuria Abdominal: No nausea, vomiting, diarrhea, bright red blood per rectum, melena, or hematemesis Neurologic: No  visual changes, weakness, changes in mental status All other systems reviewed and are otherwise negative except as noted above.  Physical Exam Vitals: Blood pressure 108/61, pulse 65, height 6\' 1"  (1.854 m), weight 191 lb (86.637 kg).  General: Well developed, well appearing 80 y.o. male in no acute distress. HEENT: Normocephalic, atraumatic. EOMs intact. Sclera nonicteric. Oropharynx clear.  Neck: Supple. No JVD. Lungs: Respirations regular and unlabored, CTA bilaterally. No wheezes, rales or rhonchi. Heart:  RRR. S1, S2 present. No murmurs, rub, S3 or S4. Abdomen: Soft, non-distended.  Extremities: No clubbing, cyanosis or edema. PT/Radials 2+ and equal bilaterally. Multiple, scattered ecchymoses on forearms bilaterally. Psych: Normal affect. Neuro: Alert and oriented X 3. Moves all extremities spontaneously. Skin: PPM implant site intact and well healed.   Diagnostics Device interrogation - Normal device function. Thresholds, sensing, impedances consistent with previous measurements. Device programmed to maximize longevity. 4 mode switch episodes, all on 03/18/2013 AM, longest 6 seconds, EGMs reviewed and no evidence of AF. No high ventricular rates noted. Device programmed at appropriate safety margins. Histogram distribution appropriate for patient activity level. Device programmed to optimize intrinsic conduction. Estimated longevity 6.5 - 7 years.  Assessment and Plan  1. Sinus node dysfunction s/p PPM implant - normal device function - no programming changes made - return for follow-up with Dr. Graciela HusbandsKlein in 6 months  2. Atrial fibrillation - post-op in 2011 - no evidence of AF on device interrogation today  3. Chronic systolic HF - stable  - continue medical therapy as per Dr. Katrinka BlazingSmith - return for follow-up with Dr. Katrinka BlazingSmith as scheduled 05/03/2013  Signed, Rick DuffEDMISTEN, Mareo Portilla, PA-C 03/22/2013, 3:28 PM

## 2013-04-18 ENCOUNTER — Encounter: Payer: Self-pay | Admitting: Internal Medicine

## 2013-05-03 ENCOUNTER — Encounter: Payer: Self-pay | Admitting: Interventional Cardiology

## 2013-05-03 ENCOUNTER — Ambulatory Visit (INDEPENDENT_AMBULATORY_CARE_PROVIDER_SITE_OTHER): Payer: Medicare Other | Admitting: Interventional Cardiology

## 2013-05-03 VITALS — BP 92/50 | HR 72 | Ht 73.0 in | Wt 195.0 lb

## 2013-05-03 DIAGNOSIS — I495 Sick sinus syndrome: Secondary | ICD-10-CM

## 2013-05-03 DIAGNOSIS — I5022 Chronic systolic (congestive) heart failure: Secondary | ICD-10-CM

## 2013-05-03 DIAGNOSIS — I2510 Atherosclerotic heart disease of native coronary artery without angina pectoris: Secondary | ICD-10-CM

## 2013-05-03 DIAGNOSIS — I4891 Unspecified atrial fibrillation: Secondary | ICD-10-CM

## 2013-05-03 NOTE — Progress Notes (Signed)
Patient ID: Joseph Hobbs, male   DOB: 04/22/1924, 80 y.o.   MRN: [id number]    [address] [place], [place]  [address] Phone: [phone] Fax:  [phone]  Date:  05/03/2013   ID:  Joseph LimboJohn Hobbs, DOB 04/28/1924, MRN [id number]  PCP:  No primary provider on file.   ASSESSMENT:  1. chronic systolic heart failure, stable 2. History of paroxysmal atrial fibrillation 3. Coronary artery disease, stable without angina 4. Low-normal blood pressure  PLAN:  1. On the higher dose of diuretic, we need to be careful that we do not volume depleted 2. I made no change in the current medical regimen 3. Clinical followup in 6 months   SUBJECTIVE: Joseph Hobbs is a 80 y.o. male doing well. Denies dyspnea. Recent upward adjustment in diuretic therapy by Dr. Valentina LucksGriffin. He denies angina. He has been having some postnasal drip and cough. Otherwise doing well.   Wt Readings from Last 3 Encounters:  05/03/13 195 lb (88.451 kg)  03/22/13 191 lb (86.637 kg)  12/07/12 202 lb (91.627 kg)     Past Medical History  Diagnosis Date  . Hypertension   . History of acute myocardial infarction 2000--  S/P STENTS X3  . S/P coronary artery stent placement   . History of CHF (congestive heart failure) POST SURGERY 2011    EF 35-40%  . PAF (paroxysmal atrial fibrillation) POST OP SURGERY 2011  . Hyperlipidemia   . Gait disorder USES WALKER  . Diabetic peripheral neuropathy   . DM (diabetes mellitus), type 2   . GERD (gastroesophageal reflux disease)   . History of esophageal stricture POST DILATION'S  . Urethral stricture   . Urinary incontinence   . Chronic kidney disease (CKD), stage III (moderate)   . History of prostate cancer 1995-- S/P PROSTATECTOMY    NO RECURRENCE  . Arthritis   . Impaired hearing   . Dry skin AT TIMES GETS LESIONS--  CURRENTLY THEY ARE HEALED  . Chronic back pain   . Asymptomatic carotid artery stenosis BILATERAL ICA   -- PER DR SMITH NOTE   . Pacemaker  PLACEMENT IN 2000 --  LAST PACER CHECK  07-08-2011  . Coronary artery disease CARDIOLOGIST- DR Verdis PrimeHENRY SMITH-- LOV  06-08-2011--  CURRENT EKG, LAST PACER CHECK , ECHO W/ CHART (NO STRESS TEST )  . Impaired memory DX ALZHEIMER'S IN 2010    Current Outpatient Prescriptions  Medication Sig Dispense Refill  . ALPRAZolam (XANAX) 0.25 MG tablet Take 0.25 mg by mouth at bedtime as needed.      Marland Kitchen. aspirin 81 MG tablet Take 81 mg by mouth daily.      . cetaphil (CETAPHIL) cream Apply topically daily.      . furosemide (LASIX) 40 MG tablet Take 80 mg by mouth daily.       Marland Kitchen. HYDROcodone-acetaminophen (NORCO/VICODIN) 5-325 MG per tablet Take 1 tablet by mouth every 6 (six) hours as needed.      . isosorbide-hydrALAZINE (BIDIL) 20-37.5 MG per tablet Take 1 tablet by mouth 2 (two) times daily.       Marland Kitchen. LORATADINE PO Take 20 mg by mouth daily.      . metoprolol (LOPRESSOR) 50 MG tablet Take 25 mg by mouth 2 (two) times daily.       . mirtazapine (REMERON) 7.5 MG tablet Take 7.5 mg by mouth at bedtime.      Marland Kitchen. NAMENDA 5 MG tablet Take 5 mg by mouth 2 (two) times daily.       .Marland Kitchen  nitroGLYCERIN (NITROSTAT) 0.4 MG SL tablet Place 0.4 mg under the tongue every 5 (five) minutes as needed.      Marland Kitchen omeprazole (PRILOSEC) 20 MG capsule Take 20 mg by mouth every morning.      Marland Kitchen oxybutynin (DITROPAN-XL) 5 MG 24 hr tablet Take 5 mg by mouth every morning. Overactive bladder      . pantoprazole (PROTONIX) 40 MG tablet Take 40 mg by mouth 2 (two) times daily.       . rivastigmine (EXELON) 9.5 mg/24hr Place 9.5 mg onto the skin daily.      . sertraline (ZOLOFT) 100 MG tablet Take 100 mg by mouth daily.      . sitaGLIPtin (JANUVIA) 50 MG tablet Take 50 mg by mouth daily.      . sodium chloride (OCEAN) 0.65 % SOLN nasal spray Place 1 spray into both nostrils as needed for congestion.      . triamcinolone (KENALOG) 0.025 % cream       . zolpidem (AMBIEN) 10 MG tablet Take 10 mg by mouth at bedtime as needed.       No current  facility-administered medications for this visit.    Allergies:    Allergies  Allergen Reactions  . Penicillins Other (See Comments)    UNKNOWN    Social History:  The patient  reports that he has never smoked. He has never used smokeless tobacco. He reports that he does not drink alcohol or use illicit drugs.   ROS:  Please see the history of present illness.   Appetite is been stable no weight loss no orthopnea. Cough without phlegm production   All other systems reviewed and negative.   OBJECTIVE: VS:  BP 92/50  Pulse 72  Ht 6\' 1"  (1.854 m)  Wt 195 lb (88.451 kg)  BMI 25.73 kg/m2 Well nourished, well developed, in no acute distress, appears consistent with stated age HEENT: normal Neck: JVD flat while sitting at 90. Carotid bruit absent bruit  Cardiac:  normal S1, S2; RRR; no murmur Lungs:  clear to auscultation bilaterally, no wheezing, rhonchi or rales Abd: soft, nontender, no hepatomegaly Ext: Edema absent. Pulses 2+ and symmetric Skin: warm and dry Neuro:  CNs 2-12 intact, no focal abnormalities noted  EKG:  Not repeated       Signed, Darci Needle III, MD 05/03/2013 9:53 AM

## 2013-05-03 NOTE — Patient Instructions (Signed)
Your physician recommends that you continue on your current medications as directed. Please refer to the Current Medication list given to you today.  Your physician wants you to follow-up in: 6 months with Dr.Smith You will receive a reminder letter in the mail two months in advance. If you don't receive a letter, please call our office to schedule the follow-up appointment.  

## 2013-09-19 ENCOUNTER — Encounter: Payer: Self-pay | Admitting: Internal Medicine

## 2013-09-19 ENCOUNTER — Ambulatory Visit (INDEPENDENT_AMBULATORY_CARE_PROVIDER_SITE_OTHER): Payer: Medicare Other | Admitting: Internal Medicine

## 2013-09-19 VITALS — BP 116/50 | HR 68 | Ht 73.0 in | Wt 192.6 lb

## 2013-09-19 DIAGNOSIS — I480 Paroxysmal atrial fibrillation: Secondary | ICD-10-CM

## 2013-09-19 DIAGNOSIS — Z950 Presence of cardiac pacemaker: Secondary | ICD-10-CM

## 2013-09-19 DIAGNOSIS — I4891 Unspecified atrial fibrillation: Secondary | ICD-10-CM

## 2013-09-19 DIAGNOSIS — I2589 Other forms of chronic ischemic heart disease: Secondary | ICD-10-CM

## 2013-09-19 DIAGNOSIS — I495 Sick sinus syndrome: Secondary | ICD-10-CM

## 2013-09-19 DIAGNOSIS — I255 Ischemic cardiomyopathy: Secondary | ICD-10-CM

## 2013-09-19 DIAGNOSIS — I2510 Atherosclerotic heart disease of native coronary artery without angina pectoris: Secondary | ICD-10-CM

## 2013-09-19 LAB — MDC_IDC_ENUM_SESS_TYPE_INCLINIC
Battery Remaining Longevity: 84 mo
Brady Statistic RV Percent Paced: 99.63 %
Date Time Interrogation Session: 20150818143826
Lead Channel Impedance Value: 437.5 Ohm
Lead Channel Pacing Threshold Amplitude: 1 V
Lead Channel Pacing Threshold Pulse Width: 0.4 ms
Lead Channel Pacing Threshold Pulse Width: 0.4 ms
Lead Channel Sensing Intrinsic Amplitude: 0.6 mV
Lead Channel Sensing Intrinsic Amplitude: 7.8 mV
Lead Channel Setting Pacing Amplitude: 1 V
MDC IDC MSMT BATTERY VOLTAGE: 2.92 V
MDC IDC MSMT LEADCHNL RA PACING THRESHOLD AMPLITUDE: 1 V
MDC IDC MSMT LEADCHNL RV IMPEDANCE VALUE: 412.5 Ohm
MDC IDC MSMT LEADCHNL RV PACING THRESHOLD AMPLITUDE: 0.75 V
MDC IDC MSMT LEADCHNL RV PACING THRESHOLD PULSEWIDTH: 0.4 ms
MDC IDC PG SERIAL: 7125769
MDC IDC SET LEADCHNL RA PACING AMPLITUDE: 2.5 V
MDC IDC SET LEADCHNL RV PACING PULSEWIDTH: 0.4 ms
MDC IDC SET LEADCHNL RV SENSING SENSITIVITY: 2 mV
MDC IDC STAT BRADY RA PERCENT PACED: 93 %

## 2013-09-19 NOTE — Progress Notes (Signed)
Patient has no care team.   HPI  Joseph Hobbs is a 80 y.o. male Seen in followup for pacemaker [place] in [place] for sinus node dysfunction. There is a history of ischemic heart disease hypertension diabetes and some atrial fibrillation. He has a history of ischemic heart disease with prior stenting. Most recent ejection fraction of 35%  His major complaint is fatigue. He denies chest pain shortness of breath or peripheral edema.  Past Medical History  Diagnosis Date  . Hypertension   . History of acute myocardial infarction 2000--  S/P STENTS X3  . S/P coronary artery stent placement   . History of CHF (congestive heart failure) POST SURGERY 2011    EF 35-40%  . PAF (paroxysmal atrial fibrillation) POST OP SURGERY 2011  . Hyperlipidemia   . Gait disorder USES WALKER  . Diabetic peripheral neuropathy   . DM (diabetes mellitus), type 2   . GERD (gastroesophageal reflux disease)   . History of esophageal stricture POST DILATION'S  . Urethral stricture   . Urinary incontinence   . Chronic kidney disease (CKD), stage III (moderate)   . History of prostate cancer 1995-- S/P PROSTATECTOMY    NO RECURRENCE  . Arthritis   . Impaired hearing   . Dry skin AT TIMES GETS LESIONS--  CURRENTLY THEY ARE HEALED  . Chronic back pain   . Asymptomatic carotid artery stenosis BILATERAL ICA   -- PER DR SMITH NOTE   . Pacemaker PLACEMENT IN 2000 --  LAST PACER CHECK  07-08-2011  . Coronary artery disease CARDIOLOGIST- DR Verdis PrimeHENRY SMITH-- LOV  06-08-2011--  CURRENT EKG, LAST PACER CHECK , ECHO W/ CHART (NO STRESS TEST )  . Impaired memory DX ALZHEIMER'S IN 2010    Past Surgical History  Procedure Laterality Date  . Prostatectomy  1995    PROSTATE CANCER  . Lumbar disc surgery  X3   LAST ONE 1990  . Coronary angioplasty with stent placement  2000    X3 [place]  [place]  . Neuroplasty / transposition median nerve at carpal tunnel bilateral    . Pilonidal cyst  excision    . Tonsillectomy and adenoidectomy    . Partial colectomy/ repair fissure  2011    DIVERTICULITIS  . Transthoracic echocardiogram  01-03-2010  DR Verdis PrimeHENRY SMITH    SEVERE CONCENTRIC LVH/ EF 35-40%/ SEVERE LEFT ATRIAL ENLARGEMENT/ MODERATE MITRAL VALVE REGURG./ GRADE IID DIASTOLIC DYSFUNCTION/ MILD AORTIC  &  TRICUSPID VALVE REGURG  . Cystoscopy with urethral dilatation  11/19/2011    Procedure: CYSTOSCOPY WITH URETHRAL DILATATION;  Surgeon: Martina SinnerScott A MacDiarmid, MD;  [facility];  Service: Urology;  Laterality: N/A;  CYSTO WITH BALLOON DILATION    Current Outpatient Prescriptions  Medication Sig Dispense Refill  . ALPRAZolam (XANAX) 0.25 MG tablet Take 0.25 mg by mouth at bedtime as needed.      Marland Kitchen. aspirin 81 MG tablet Take 81 mg by mouth daily.      . cetaphil (CETAPHIL) cream Apply topically daily.      . furosemide (LASIX) 40 MG tablet Take 80 mg by mouth daily.       Marland Kitchen. HYDROcodone-acetaminophen (NORCO/VICODIN) 5-325 MG per tablet Take 1 tablet by mouth every 6 (six) hours as needed.      . isosorbide-hydrALAZINE (BIDIL) 20-37.5 MG per tablet Take 1 tablet by mouth 2 (two) times daily.       Marland Kitchen. LORATADINE PO Take 20 mg by mouth daily.      .Marland Kitchen  metoprolol (LOPRESSOR) 50 MG tablet Take 25 mg by mouth 2 (two) times daily.       . mirtazapine (REMERON) 7.5 MG tablet Take 7.5 mg by mouth at bedtime.      Marland Kitchen NAMENDA 5 MG tablet Take 5 mg by mouth 2 (two) times daily.       . nitroGLYCERIN (NITROSTAT) 0.4 MG SL tablet Place 0.4 mg under the tongue every 5 (five) minutes as needed.      Marland Kitchen omeprazole (PRILOSEC) 20 MG capsule Take 20 mg by mouth every morning.      Marland Kitchen oxybutynin (DITROPAN-XL) 5 MG 24 hr tablet Take 5 mg by mouth every morning. Overactive bladder      . pantoprazole (PROTONIX) 40 MG tablet Take 40 mg by mouth 2 (two) times daily.       . rivastigmine (EXELON) 9.5 mg/24hr Place 9.5 mg onto the skin daily.      . sertraline (ZOLOFT) 100 MG tablet Take 100 mg  by mouth daily.      . sitaGLIPtin (JANUVIA) 50 MG tablet Take 50 mg by mouth daily.      . sodium chloride (OCEAN) 0.65 % SOLN nasal spray Place 1 spray into both nostrils as needed for congestion.      . triamcinolone (KENALOG) 0.025 % cream       . zolpidem (AMBIEN) 10 MG tablet Take 10 mg by mouth at bedtime as needed.       No current facility-administered medications for this visit.    Allergies  Allergen Reactions  . Penicillins Other (See Comments)    UNKNOWN    Review of Systems negative except from HPI and PMH  Physical Exam BP 116/50  Pulse 68  Ht 6\' 1"  (1.854 m)  Wt 192 lb 9.6 oz (87.363 kg)  BMI 25.42 kg/m2 Well developed and well nourished in no acute distress HENT normal E scleral and icterus clear Neck Supple JVP flat; carotids brisk and full Clear to ausculation Device pocket well healed; without hematoma or erythema.  There is no tethering *Regular rate and rhythm, no murmurs gallops or rub Soft with active bowel sounds No clubbing cyanosis  Edema Alert and oriented, grossly normal motor and sensory function Skin Warm and Dry    Assessment and  Plan  Sinus node dysfunction  First degree AV block  Pacemaker-[employer] The patient's device was interrogated.  The information was reviewed. No changes were made in the programming.    Fatigue  Coronary artery disease with prior stenting  Ischemic cardiomyopathy  Atrial fibrillation-paroxysmal He's had only very brief paroxysms of atrial fibrillation the longest being 1 minute. He is not on anticoagulation I will defer this decision to Dr. Katrinka Blazing   he has significant fatigue. I wonder whether it could be related to his medications. I will undertake beta blocker exclusion trial. I will defer other exclusions or down titration is to Dr. Pete Glatter  Without symptoms of ischemia  Euvolemic continue current meds

## 2013-09-19 NOTE — Patient Instructions (Signed)
Your physician recommends that you continue on your current medications as directed. Please refer to the Current Medication list given to you today.  Remote monitoring is used to monitor your Pacemaker of ICD from home. This monitoring reduces the number of office visits required to check your device to one time per year. It allows us to keep an eye on the functioning of your device to ensure it is working properly. You are scheduled for a device check from home on 12/21/13. You may send your transmission at any time that day. If you have a wireless device, the transmission will be sent automatically. After your physician reviews your transmission, you will receive a postcard with your next transmission date.  Your physician wants you to follow-up in: 1 year with Dr. Graciela HusbandsKlein.  You will receive a reminder letter in the mail two months in advance. If you don't receive a letter, please call our office to schedule the follow-up appointment.

## 2013-09-27 ENCOUNTER — Encounter: Payer: Self-pay | Admitting: Clinical Cardiac Electrophysiology

## 2013-10-13 ENCOUNTER — Encounter (HOSPITAL_COMMUNITY): Payer: Self-pay | Admitting: Emergency Medicine

## 2013-10-13 ENCOUNTER — Emergency Department (HOSPITAL_COMMUNITY): Payer: Medicare Other

## 2013-10-13 ENCOUNTER — Emergency Department (HOSPITAL_COMMUNITY)
Admission: EM | Admit: 2013-10-13 | Discharge: 2013-10-13 | Disposition: A | Discharge status: Home / Self Care | Payer: Medicare Other | Attending: Emergency Medicine | Admitting: Emergency Medicine

## 2013-10-13 DIAGNOSIS — N183 Chronic kidney disease, stage 3 unspecified: Secondary | ICD-10-CM | POA: Diagnosis not present

## 2013-10-13 DIAGNOSIS — I252 Old myocardial infarction: Secondary | ICD-10-CM | POA: Diagnosis not present

## 2013-10-13 DIAGNOSIS — E1149 Type 2 diabetes mellitus with other diabetic neurological complication: Secondary | ICD-10-CM | POA: Insufficient documentation

## 2013-10-13 DIAGNOSIS — I129 Hypertensive chronic kidney disease with stage 1 through stage 4 chronic kidney disease, or unspecified chronic kidney disease: Secondary | ICD-10-CM | POA: Diagnosis not present

## 2013-10-13 DIAGNOSIS — E1142 Type 2 diabetes mellitus with diabetic polyneuropathy: Secondary | ICD-10-CM | POA: Diagnosis not present

## 2013-10-13 DIAGNOSIS — M549 Dorsalgia, unspecified: Secondary | ICD-10-CM | POA: Insufficient documentation

## 2013-10-13 DIAGNOSIS — G8929 Other chronic pain: Secondary | ICD-10-CM

## 2013-10-13 DIAGNOSIS — IMO0002 Reserved for concepts with insufficient information to code with codable children: Secondary | ICD-10-CM | POA: Insufficient documentation

## 2013-10-13 DIAGNOSIS — Z8546 Personal history of malignant neoplasm of prostate: Secondary | ICD-10-CM | POA: Insufficient documentation

## 2013-10-13 DIAGNOSIS — Z880 Allergy status to penicillin: Secondary | ICD-10-CM | POA: Diagnosis not present

## 2013-10-13 DIAGNOSIS — M545 Low back pain, unspecified: Secondary | ICD-10-CM | POA: Insufficient documentation

## 2013-10-13 DIAGNOSIS — Z87448 Personal history of other diseases of urinary system: Secondary | ICD-10-CM | POA: Insufficient documentation

## 2013-10-13 DIAGNOSIS — I2510 Atherosclerotic heart disease of native coronary artery without angina pectoris: Secondary | ICD-10-CM | POA: Insufficient documentation

## 2013-10-13 DIAGNOSIS — K219 Gastro-esophageal reflux disease without esophagitis: Secondary | ICD-10-CM | POA: Insufficient documentation

## 2013-10-13 DIAGNOSIS — M129 Arthropathy, unspecified: Secondary | ICD-10-CM | POA: Insufficient documentation

## 2013-10-13 DIAGNOSIS — I509 Heart failure, unspecified: Secondary | ICD-10-CM | POA: Insufficient documentation

## 2013-10-13 DIAGNOSIS — Z7982 Long term (current) use of aspirin: Secondary | ICD-10-CM | POA: Diagnosis not present

## 2013-10-13 DIAGNOSIS — Z9861 Coronary angioplasty status: Secondary | ICD-10-CM | POA: Insufficient documentation

## 2013-10-13 DIAGNOSIS — Z79899 Other long term (current) drug therapy: Secondary | ICD-10-CM | POA: Insufficient documentation

## 2013-10-13 LAB — URINALYSIS, ROUTINE W REFLEX MICROSCOPIC
Bilirubin Urine: NEGATIVE
GLUCOSE, UA: NEGATIVE mg/dL
KETONES UR: NEGATIVE mg/dL
Leukocytes, UA: NEGATIVE
NITRITE: NEGATIVE
PH: 5 (ref 5.0–8.0)
PROTEIN: NEGATIVE mg/dL
Specific Gravity, Urine: 1.016 (ref 1.005–1.030)
Urobilinogen, UA: 0.2 mg/dL (ref 0.0–1.0)

## 2013-10-13 LAB — URINE MICROSCOPIC-ADD ON

## 2013-10-13 MED ORDER — OXYCODONE-ACETAMINOPHEN 5-325 MG PO TABS: ORAL_TABLET | ORAL | Status: DC

## 2013-10-13 MED ORDER — OXYCODONE-ACETAMINOPHEN 5-325 MG PO TABS
1.0000 | ORAL_TABLET | Freq: Once | ORAL | Status: AC
  Administered 2013-10-13: 1 via ORAL
  Filled 2013-10-13: qty 1

## 2013-10-13 NOTE — ED Provider Notes (Signed)
See prior note   Ward Givens, MD 10/13/13 1610

## 2013-10-13 NOTE — ED Provider Notes (Signed)
CSN: [id number]     Arrival date & time 10/13/13  1248 History   First MD Initiated Contact with Patient 10/13/13 1257     Chief Complaint  Patient presents with  . Back Pain     (Consider location/radiation/quality/duration/timing/severity/associated sxs/prior Treatment) HPI  Joseph Hobbs is a 80 y.o. male brought in by EMS for evaluation of worsening chronic back pain that radiates to the left hip (it does not go down the leg) pain is 8/10 and exacerbated by movement and weightbearing. He's been taking Vicodin at home with little relief. Patient has a long-standing history of degenerative disc disease and has had lumbar surgeries to address this in the remote past in [place]. Patient had lumbar x-ray 5 days ago and there was concern for triple A. Has history of hypertension. Patient denies fever, chills, weakness, difficulty ambulating, change in bowel or bladder habits, history of IV drug use, difficulty ambulating, abdominal pain, hematuria, chest pain, shortness of breath, nausea, vomiting.   PCP: [facility]  Past Medical History  Diagnosis Date  . Hypertension   . History of acute myocardial infarction 2000--  S/P STENTS X3  . S/P coronary artery stent placement   . History of CHF (congestive heart failure) POST SURGERY 2011    EF 35-40%  . PAF (paroxysmal atrial fibrillation) POST OP SURGERY 2011  . Hyperlipidemia   . Gait disorder USES WALKER  . Diabetic peripheral neuropathy   . DM (diabetes mellitus), type 2   . GERD (gastroesophageal reflux disease)   . History of esophageal stricture POST DILATION'S  . Urethral stricture   . Urinary incontinence   . Chronic kidney disease (CKD), stage III (moderate)   . History of prostate cancer 1995-- S/P PROSTATECTOMY    NO RECURRENCE  . Arthritis   . Impaired hearing   . Dry skin AT TIMES GETS LESIONS--  CURRENTLY THEY ARE HEALED  . Chronic back pain   . Asymptomatic carotid artery stenosis BILATERAL ICA   -- PER DR SMITH NOTE    . Pacemaker PLACEMENT IN 2000 --  LAST PACER CHECK  07-08-2011  . Coronary artery disease CARDIOLOGIST- DR Verdis Prime-- LOV  06-08-2011--  CURRENT EKG, LAST PACER CHECK , ECHO W/ CHART (NO STRESS TEST )  . Impaired memory DX ALZHEIMER'S IN 2010   Past Surgical History  Procedure Laterality Date  . Prostatectomy  1995    PROSTATE CANCER  . Lumbar disc surgery  X3   LAST ONE 1990  . Coronary angioplasty with stent placement  2000    [place]  [place]  . Neuroplasty / transposition median nerve at carpal tunnel bilateral    . Pilonidal cyst excision    . Tonsillectomy and adenoidectomy    . Partial colectomy/ repair fissure  2011    DIVERTICULITIS  . Transthoracic echocardiogram  01-03-2010  DR Verdis Prime    SEVERE CONCENTRIC LVH/ EF 35-40%/ SEVERE LEFT ATRIAL ENLARGEMENT/ MODERATE MITRAL VALVE REGURG./ GRADE IID DIASTOLIC DYSFUNCTION/ MILD AORTIC  &  TRICUSPID VALVE REGURG  . Cystoscopy with urethral dilatation  11/19/2011    Procedure: CYSTOSCOPY WITH URETHRAL DILATATION;  Surgeon: Martina Sinner, MD;  Location: [facility];  Service: Urology;  Laterality: N/A;  CYSTO WITH BALLOON DILATION   Family History  Problem Relation Age of Onset  . Heart disease Mother    History  Substance Use Topics  . Smoking status: Never Smoker   . Smokeless tobacco: Never Used  . Alcohol Use: No  Review of Systems  10 systems reviewed and found to be negative, except as noted in the HPI.  Allergies  Penicillins and Soap  Home Medications   Prior to Admission medications   Medication Sig Start Date End Date Taking? Authorizing Provider  ALPRAZolam (XANAX) 0.25 MG tablet Take 0.25 mg by mouth at bedtime as needed.   Yes Historical Provider, MD  aspirin 81 MG tablet Take 81 mg by mouth daily.   Yes Historical Provider, MD  calcium carbonate (OS-CAL - DOSED IN MG OF ELEMENTAL CALCIUM) 1250 MG tablet Take 1 tablet by mouth.   Yes Historical Provider, MD   cetaphil (CETAPHIL) cream Apply topically daily.   Yes Historical Provider, MD  Dexlansoprazole 30 MG capsule Take 30 mg by mouth daily.   Yes Historical Provider, MD  Fluticasone-Salmeterol (ADVAIR) 100-50 MCG/DOSE AEPB Inhale 1 puff into the lungs 2 (two) times daily.   Yes Historical Provider, MD  furosemide (LASIX) 40 MG tablet Take 80 mg by mouth 2 (two) times daily.    Yes Historical Provider, MD  glimepiride (AMARYL) 2 MG tablet Take 2 mg by mouth daily with breakfast.   Yes Historical Provider, MD  isosorbide-hydrALAZINE (BIDIL) 20-37.5 MG per tablet Take 1 tablet by mouth 2 (two) times daily.    Yes Historical Provider, MD  Melatonin 5 MG TABS Take 5 mg by mouth at bedtime.   Yes Historical Provider, MD  metoprolol succinate (TOPROL-XL) 25 MG 24 hr tablet Take 25 mg by mouth daily.   Yes Historical Provider, MD  mirtazapine (REMERON) 7.5 MG tablet Take 7.5 mg by mouth at bedtime.   Yes Historical Provider, MD  NAMENDA 5 MG tablet Take 5 mg by mouth 2 (two) times daily.  11/25/12  Yes Historical Provider, MD  omeprazole (PRILOSEC) 20 MG capsule Take 20 mg by mouth every morning.   Yes Historical Provider, MD  rivastigmine (EXELON) 9.5 mg/24hr Place 9.5 mg onto the skin daily.   Yes Historical Provider, MD  sertraline (ZOLOFT) 100 MG tablet Take 100 mg by mouth daily.   Yes Historical Provider, MD  sitaGLIPtin (JANUVIA) 50 MG tablet Take 50 mg by mouth daily.   Yes Historical Provider, MD  triamcinolone (KENALOG) 0.025 % cream  09/09/12  Yes Historical Provider, MD  zolpidem (AMBIEN) 10 MG tablet Take 10 mg by mouth at bedtime as needed.   Yes Historical Provider, MD  nitroGLYCERIN (NITROSTAT) 0.4 MG SL tablet Place 0.4 mg under the tongue every 5 (five) minutes as needed.    Historical Provider, MD  oxyCODONE-acetaminophen (PERCOCET/ROXICET) 5-325 MG per tablet 1 to 2 tabs PO q6hrs  PRN for pain 10/13/13   Joni Reining Maciah Feeback, PA-C   BP 124/54  Pulse 59  Temp(Src) 97.7 F (36.5 C) (Oral)   Resp 15  SpO2 98% Physical Exam  Nursing note and vitals reviewed. Constitutional: He is oriented to person, place, and time. He appears well-developed and well-nourished. No distress.  HENT:  Head: Normocephalic and atraumatic.  Mouth/Throat: Oropharynx is clear and moist.  Eyes: Conjunctivae and EOM are normal. Pupils are equal, round, and reactive to light.  Neck: Normal range of motion. Neck supple.  Cardiovascular: Normal rate, regular rhythm and intact distal pulses.   Pulmonary/Chest: Effort normal and breath sounds normal. No stridor.  Abdominal: Soft. Bowel sounds are normal. There is no tenderness.  Musculoskeletal: Normal range of motion.  Strength is 5 out of 5 to bilateral lower extremities. Distal sensation is grossly intact.  Neurological: He is alert  and oriented to person, place, and time.  Psychiatric: He has a normal mood and affect.    ED Course  Procedures (including critical care time) Labs Review Labs Reviewed  URINALYSIS, ROUTINE W REFLEX MICROSCOPIC - Abnormal; Notable for the following:    Hgb urine dipstick TRACE (*)    All other components within normal limits  URINE MICROSCOPIC-ADD ON    Imaging Review US Aorta  10/13/2013   CLINICAL DATA:  Back pain  EXAM: ULTRASOUND OF ABDOMINAL AORTA  TECHNIQUE: Ultrasound examination of the abdominal aorta was performed to evaluate for abdominal aortic aneurysm.  COMPARISON:  None.  FINDINGS: Abdominal Aorta  No aneurysm identified.  Maximum AP  Diameter:  2.0 cm  Maximum TRV  Diameter: 2.7 cm  IMPRESSION: No evidence of an abdominal aortic aneurysm.   Electronically Signed   By: Elige Ko   On: 10/13/2013 15:08     EKG Interpretation None      MDM   Final diagnoses:  Exacerbation of chronic back pain    Filed Vitals:   10/13/13 1253 10/13/13 1331 10/13/13 1522  BP: 137/56 132/65 124/54  Pulse: 62 64 59  Temp: 97.7 F (36.5 C)    TempSrc: Oral    Resp: SpO2: 100% 99% 98%     Medications  oxyCODONE-acetaminophen (PERCOCET/ROXICET) 5-325 MG per tablet 1 tablet (1 tablet Oral Given 10/13/13 1328)    Tildon Silveria is a 80 y.o. male presenting with exacerbation of chronic low back pain. Patient is ambulatory with no neuro deficits. There is concern for AAA based on x-ray performed earlier in the week. Patient will be given Percocet, urinalysis and aortic ultrasound ordered.  Ultrasound shows no signs of significant triple A. Patient will be given prescription for Percocet, we have discussed fall precautions. I will ask him to follow with his primary care physician, Dr. Pete Glatter for further management of his chronic low back pain.  This is a shared visit with the attending physician who personally evaluated the patient and agrees with the care plan.   Evaluation does not show pathology that would require ongoing emergent intervention or inpatient treatment. Pt is hemodynamically stable and mentating appropriately. Discussed findings and plan with patient/guardian, who agrees with care plan. All questions answered. Return precautions discussed and outpatient follow up given.   New Prescriptions   OXYCODONE-ACETAMINOPHEN (PERCOCET/ROXICET) 5-325 MG PER TABLET    1 to 2 tabs PO q6hrs  PRN for pain         Wynetta Emery, PA-C 10/13/13 1545

## 2013-10-13 NOTE — ED Notes (Signed)
Pt denies cp or sob. Reports lower back pain and left hip pain which is consistent with chronic pain. Pt is A X 4.

## 2013-10-13 NOTE — Discharge Instructions (Signed)
Take percocet for breakthrough pain, do not drink alcohol, drive, care for children or do other critical tasks while taking percocet.  Please be very careful not to fall! The pain medication and back pain puts you at risk for falls. Please rest as much as possible and try to not stay alone.   Please follow with your primary care doctor in the next 2 days for a check-up. They must obtain records for further management.   Do not hesitate to return to the Emergency Department for any new, worsening or concerning symptoms.

## 2013-10-13 NOTE — ED Provider Notes (Signed)
Pt states he can't remember not having back pain, he reports it has gotten worse the past 3 days. He has been on hydrocodone "for years". No change in activity or known new injury.  Pt has no incontinence.   Pt is alert and cooperative, flat affect. Back exam deferred to PA  Medical screening examination/treatment/procedure(s) were conducted as a shared visit with non-physician practitioner(s) and myself.  I personally evaluated the patient during the encounter.   EKG Interpretation None       Devoria Albe, MD, Armando Gang   Ward Givens, MD 10/13/13 [phone]

## 2013-10-13 NOTE — ED Notes (Signed)
Per EMS- Pt here with chronic back pain and DDD; reports worsening pain for 3 days, has been taking vicodin with no relief and hasn't been able to sleep; reports patient had xray awhile ago, and concern for aaa. Pacemaker HR 60, BP 110/70, 100%.

## 2013-10-13 NOTE — ED Notes (Signed)
To us

## 2013-10-17 ENCOUNTER — Other Ambulatory Visit (HOSPITAL_COMMUNITY): Payer: Self-pay | Admitting: Nephrology

## 2013-10-17 ENCOUNTER — Ambulatory Visit (HOSPITAL_COMMUNITY)
Admission: RE | Admit: 2013-10-17 | Discharge: 2013-10-17 | Disposition: A | Discharge status: Home / Self Care | Payer: Medicare Other | Source: Ambulatory Visit | Attending: Nephrology | Admitting: Nephrology

## 2013-10-17 DIAGNOSIS — N184 Chronic kidney disease, stage 4 (severe): Secondary | ICD-10-CM

## 2013-11-02 DIAGNOSIS — L03119 Cellulitis of unspecified part of limb: Secondary | ICD-10-CM

## 2013-11-02 HISTORY — DX: Cellulitis of unspecified part of limb: L03.119

## 2013-11-04 ENCOUNTER — Inpatient Hospital Stay (HOSPITAL_COMMUNITY)
Admission: EM | Admit: 2013-11-04 | Discharge: 2013-11-08 | DRG: 292 | Disposition: A | Discharge status: Skilled Nursing Facility | Payer: Medicare Other | Attending: Internal Medicine | Admitting: Internal Medicine

## 2013-11-04 ENCOUNTER — Encounter (HOSPITAL_COMMUNITY): Payer: Self-pay | Admitting: Emergency Medicine

## 2013-11-04 ENCOUNTER — Emergency Department (HOSPITAL_COMMUNITY): Payer: Medicare Other

## 2013-11-04 DIAGNOSIS — R0602 Shortness of breath: Secondary | ICD-10-CM | POA: Diagnosis present

## 2013-11-04 DIAGNOSIS — I5022 Chronic systolic (congestive) heart failure: Secondary | ICD-10-CM

## 2013-11-04 DIAGNOSIS — Z7982 Long term (current) use of aspirin: Secondary | ICD-10-CM

## 2013-11-04 DIAGNOSIS — E785 Hyperlipidemia, unspecified: Secondary | ICD-10-CM | POA: Diagnosis present

## 2013-11-04 DIAGNOSIS — I5023 Acute on chronic systolic (congestive) heart failure: Secondary | ICD-10-CM

## 2013-11-04 DIAGNOSIS — N359 Urethral stricture, unspecified: Secondary | ICD-10-CM | POA: Diagnosis present

## 2013-11-04 DIAGNOSIS — I252 Old myocardial infarction: Secondary | ICD-10-CM

## 2013-11-04 DIAGNOSIS — D500 Iron deficiency anemia secondary to blood loss (chronic): Secondary | ICD-10-CM | POA: Diagnosis present

## 2013-11-04 DIAGNOSIS — J479 Bronchiectasis, uncomplicated: Secondary | ICD-10-CM | POA: Diagnosis present

## 2013-11-04 DIAGNOSIS — R32 Unspecified urinary incontinence: Secondary | ICD-10-CM | POA: Diagnosis present

## 2013-11-04 DIAGNOSIS — I2510 Atherosclerotic heart disease of native coronary artery without angina pectoris: Secondary | ICD-10-CM | POA: Diagnosis present

## 2013-11-04 DIAGNOSIS — Z8546 Personal history of malignant neoplasm of prostate: Secondary | ICD-10-CM

## 2013-11-04 DIAGNOSIS — J449 Chronic obstructive pulmonary disease, unspecified: Secondary | ICD-10-CM | POA: Diagnosis present

## 2013-11-04 DIAGNOSIS — Z79899 Other long term (current) drug therapy: Secondary | ICD-10-CM

## 2013-11-04 DIAGNOSIS — J810 Acute pulmonary edema: Secondary | ICD-10-CM

## 2013-11-04 DIAGNOSIS — Z8249 Family history of ischemic heart disease and other diseases of the circulatory system: Secondary | ICD-10-CM

## 2013-11-04 DIAGNOSIS — E1142 Type 2 diabetes mellitus with diabetic polyneuropathy: Secondary | ICD-10-CM | POA: Diagnosis present

## 2013-11-04 DIAGNOSIS — Z955 Presence of coronary angioplasty implant and graft: Secondary | ICD-10-CM

## 2013-11-04 DIAGNOSIS — E876 Hypokalemia: Secondary | ICD-10-CM | POA: Diagnosis not present

## 2013-11-04 DIAGNOSIS — M1990 Unspecified osteoarthritis, unspecified site: Secondary | ICD-10-CM | POA: Diagnosis present

## 2013-11-04 DIAGNOSIS — R5381 Other malaise: Secondary | ICD-10-CM

## 2013-11-04 DIAGNOSIS — K222 Esophageal obstruction: Secondary | ICD-10-CM | POA: Diagnosis present

## 2013-11-04 DIAGNOSIS — Z950 Presence of cardiac pacemaker: Secondary | ICD-10-CM

## 2013-11-04 DIAGNOSIS — R269 Unspecified abnormalities of gait and mobility: Secondary | ICD-10-CM | POA: Diagnosis present

## 2013-11-04 DIAGNOSIS — Z91048 Other nonmedicinal substance allergy status: Secondary | ICD-10-CM

## 2013-11-04 DIAGNOSIS — I6523 Occlusion and stenosis of bilateral carotid arteries: Secondary | ICD-10-CM | POA: Diagnosis present

## 2013-11-04 DIAGNOSIS — G8929 Other chronic pain: Secondary | ICD-10-CM | POA: Diagnosis present

## 2013-11-04 DIAGNOSIS — M549 Dorsalgia, unspecified: Secondary | ICD-10-CM | POA: Diagnosis present

## 2013-11-04 DIAGNOSIS — N184 Chronic kidney disease, stage 4 (severe): Secondary | ICD-10-CM | POA: Diagnosis present

## 2013-11-04 DIAGNOSIS — Z66 Do not resuscitate: Secondary | ICD-10-CM | POA: Diagnosis present

## 2013-11-04 DIAGNOSIS — F0280 Dementia in other diseases classified elsewhere without behavioral disturbance: Secondary | ICD-10-CM | POA: Diagnosis present

## 2013-11-04 DIAGNOSIS — I129 Hypertensive chronic kidney disease with stage 1 through stage 4 chronic kidney disease, or unspecified chronic kidney disease: Secondary | ICD-10-CM | POA: Diagnosis present

## 2013-11-04 DIAGNOSIS — G309 Alzheimer's disease, unspecified: Secondary | ICD-10-CM | POA: Diagnosis present

## 2013-11-04 DIAGNOSIS — I509 Heart failure, unspecified: Secondary | ICD-10-CM

## 2013-11-04 DIAGNOSIS — K219 Gastro-esophageal reflux disease without esophagitis: Secondary | ICD-10-CM | POA: Diagnosis present

## 2013-11-04 DIAGNOSIS — I480 Paroxysmal atrial fibrillation: Secondary | ICD-10-CM | POA: Diagnosis present

## 2013-11-04 DIAGNOSIS — E119 Type 2 diabetes mellitus without complications: Secondary | ICD-10-CM

## 2013-11-04 DIAGNOSIS — Z880 Allergy status to penicillin: Secondary | ICD-10-CM

## 2013-11-04 HISTORY — DX: Acute on chronic systolic (congestive) heart failure: I50.23

## 2013-11-04 LAB — I-STAT TROPONIN, ED: Troponin i, poc: 0.15 ng/mL (ref 0.00–0.08)

## 2013-11-04 LAB — BASIC METABOLIC PANEL
Anion gap: 18 — ABNORMAL HIGH (ref 5–15)
BUN: 118 mg/dL — AB (ref 6–23)
CALCIUM: 8.6 mg/dL (ref 8.4–10.5)
CO2: 24 mEq/L (ref 19–32)
CREATININE: 3.27 mg/dL — AB (ref 0.50–1.35)
Chloride: 99 mEq/L (ref 96–112)
GFR, EST AFRICAN AMERICAN: 18 mL/min — AB (ref >90)
GFR, EST NON AFRICAN AMERICAN: 16 mL/min — AB (ref >90)
Glucose, Bld: 120 mg/dL — ABNORMAL HIGH (ref 70–99)
Potassium: 3.5 mEq/L — ABNORMAL LOW (ref 3.7–5.3)
Sodium: 141 mEq/L (ref 137–147)

## 2013-11-04 LAB — CBC WITH DIFFERENTIAL/PLATELET
BASOS PCT: 0 % (ref 0–1)
Basophils Absolute: 0 10*3/uL (ref 0.0–0.1)
EOS ABS: 0.1 10*3/uL (ref 0.0–0.7)
Eosinophils Relative: 1 % (ref 0–5)
HEMATOCRIT: 32.1 % — AB (ref 39.0–52.0)
Hemoglobin: 9.8 g/dL — ABNORMAL LOW (ref 13.0–17.0)
Lymphocytes Relative: 6 % — ABNORMAL LOW (ref 12–46)
Lymphs Abs: 0.5 10*3/uL — ABNORMAL LOW (ref 0.7–4.0)
MCH: 27.1 pg (ref 26.0–34.0)
MCHC: 30.5 g/dL (ref 30.0–36.0)
MCV: 88.7 fL (ref 78.0–100.0)
MONO ABS: 0.9 10*3/uL (ref 0.1–1.0)
MONOS PCT: 11 % (ref 3–12)
NEUTROS ABS: 6.9 10*3/uL (ref 1.7–7.7)
NEUTROS PCT: 82 % — AB (ref 43–77)
Platelets: 193 10*3/uL (ref 150–400)
RBC: 3.62 MIL/uL — ABNORMAL LOW (ref 4.22–5.81)
RDW: 16 % — AB (ref 11.5–15.5)
WBC: 8.4 10*3/uL (ref 4.0–10.5)

## 2013-11-04 LAB — TROPONIN I: Troponin I: <0.3 ng/mL (ref <0.30)

## 2013-11-04 LAB — PRO B NATRIURETIC PEPTIDE: PRO B NATRI PEPTIDE: 16031 pg/mL — AB (ref 0–450)

## 2013-11-04 MED ORDER — ASPIRIN 325 MG PO TABS
325.0000 mg | ORAL_TABLET | Freq: Once | ORAL | Status: AC
  Administered 2013-11-04: 325 mg via ORAL
  Filled 2013-11-04: qty 1

## 2013-11-04 MED ORDER — FUROSEMIDE 10 MG/ML IJ SOLN
20.0000 mg | Freq: Once | INTRAMUSCULAR | Status: AC
  Administered 2013-11-04: 20 mg via INTRAVENOUS
  Filled 2013-11-04: qty 2

## 2013-11-04 NOTE — ED Notes (Signed)
Patient transported to X-ray 

## 2013-11-04 NOTE — ED Provider Notes (Signed)
CSN: [id number]     Arrival date & time 11/04/13  1946 History   First MD Initiated Contact with Patient 11/04/13 1954     Chief Complaint  Patient presents with  . Shortness of Breath   Patient is a 80 y.o. male presenting with shortness of breath. The history is provided by the patient and medical records.  Shortness of Breath Severity:  Moderate Onset quality:  Gradual Duration:  1 week Timing:  Constant Progression:  Worsening Chronicity:  New Relieved by:  Nothing Ineffective treatments: albuterol nebs. Associated symptoms: no abdominal pain, no chest pain, no cough, no fever, no headaches, no hemoptysis, no neck pain, no rash, no sore throat, no vomiting and no wheezing     no history of hypertension, CAD, HLD, DM, GERD and CK-MB stage III who presents with shortness of breath. Symptom onset was one week ago. Patient states that he does not have chest pain. He states it is becoming increasingly more difficult to breathe. He states it has shortness of breath is actually improved with taking deep breaths. He denies leg swelling, cough, fever. The patient does hour oxygen at baseline.   Past Medical History  Diagnosis Date  . Hypertension   . History of acute myocardial infarction 2000--  S/P STENTS X3  . S/P coronary artery stent placement   . History of CHF (congestive heart failure) POST SURGERY 2011    EF 35-40%  . PAF (paroxysmal atrial fibrillation) POST OP SURGERY 2011  . Hyperlipidemia   . Gait disorder USES WALKER  . Diabetic peripheral neuropathy   . DM (diabetes mellitus), type 2   . GERD (gastroesophageal reflux disease)   . History of esophageal stricture POST DILATION'S  . Urethral stricture   . Urinary incontinence   . Chronic kidney disease (CKD), stage III (moderate)   . History of prostate cancer 1995-- S/P PROSTATECTOMY    NO RECURRENCE  . Arthritis   . Impaired hearing   . Dry skin AT TIMES GETS LESIONS--  CURRENTLY THEY ARE HEALED  . Chronic back pain    . Asymptomatic carotid artery stenosis BILATERAL ICA   -- PER DR SMITH NOTE   . Pacemaker PLACEMENT IN 2000 --  LAST PACER CHECK  07-08-2011  . Coronary artery disease CARDIOLOGIST- DR Verdis Prime-- LOV  06-08-2011--  CURRENT EKG, LAST PACER CHECK , ECHO W/ CHART (NO STRESS TEST )  . Impaired memory DX ALZHEIMER'S IN 2010  . CHF (congestive heart failure)    Past Surgical History  Procedure Laterality Date  . Prostatectomy  1995    PROSTATE CANCER  . Lumbar disc surgery  X3   LAST ONE 1990  . Coronary angioplasty with stent placement  2000    [place]3 [place]  [place]  . Neuroplasty / transposition median nerve at carpal tunnel bilateral    . Pilonidal cyst excision    . Tonsillectomy and adenoidectomy    . Partial colectomy/ repair fissure  2011    DIVERTICULITIS  . Transthoracic echocardiogram  01-03-2010  DR Verdis Prime    SEVERE CONCENTRIC LVH/ EF 35-40%/ SEVERE LEFT ATRIAL ENLARGEMENT/ MODERATE MITRAL VALVE REGURG./ GRADE IID DIASTOLIC DYSFUNCTION/ MILD AORTIC  &  TRICUSPID VALVE REGURG  . Cystoscopy with urethral dilatation  11/19/2011    Procedure: CYSTOSCOPY WITH URETHRAL DILATATION;  Surgeon: Martina Sinner, MD;  Location: [facility];  Service: Urology;  Laterality: N/A;  CYSTO WITH BALLOON DILATION   Family History  Problem Relation Age of  Onset  . Heart disease Mother    History  Substance Use Topics  . Smoking status: Never Smoker   . Smokeless tobacco: Never Used  . Alcohol Use: No    Review of Systems  Constitutional: Negative for fever and chills.  HENT: Negative for rhinorrhea and sore throat.   Eyes: Negative for visual disturbance.  Respiratory: Positive for shortness of breath. Negative for cough, hemoptysis, wheezing and stridor.   Cardiovascular: Negative for chest pain, palpitations and leg swelling.  Gastrointestinal: Negative for nausea, vomiting, abdominal pain, diarrhea and constipation.  Genitourinary: Negative for  dysuria and hematuria.  Musculoskeletal: Negative for back pain and neck pain.  Skin: Negative for rash.  Neurological: Negative for syncope and headaches.  Psychiatric/Behavioral: Negative for confusion.  All other systems reviewed and are negative.  Allergies  Penicillins and Soap  Home Medications   Prior to Admission medications   Medication Sig Start Date End Date Taking? Authorizing Provider  aspirin 81 MG tablet Take 81 mg by mouth daily.   Yes Historical Provider, MD  calcium carbonate (OS-CAL - DOSED IN MG OF ELEMENTAL CALCIUM) 1250 MG tablet Take 1 tablet by mouth.   Yes Historical Provider, MD  cetaphil (CETAPHIL) cream Apply topically daily.   Yes Historical Provider, MD  Dexlansoprazole 30 MG capsule Take 30 mg by mouth daily.   Yes Historical Provider, MD  ferrous sulfate 325 (65 FE) MG tablet Take 325 mg by mouth 2 (two) times daily with a meal.   Yes Historical Provider, MD  Fluticasone-Salmeterol (ADVAIR) 100-50 MCG/DOSE AEPB Inhale 1 puff into the lungs 2 (two) times daily.   Yes Historical Provider, MD  furosemide (LASIX) 40 MG tablet Take 80 mg by mouth 2 (two) times daily.    Yes Historical Provider, MD  glimepiride (AMARYL) 2 MG tablet Take 2 mg by mouth daily with breakfast.   Yes Historical Provider, MD  HYDROcodone-acetaminophen (NORCO) 7.5-325 MG per tablet Take 1 tablet by mouth every 6 (six) hours as needed for moderate pain.   Yes Historical Provider, MD  isosorbide-hydrALAZINE (BIDIL) 20-37.5 MG per tablet Take 1 tablet by mouth 2 (two) times daily.    Yes Historical Provider, MD  Melatonin 5 MG TABS Take 5 mg by mouth at bedtime.   Yes Historical Provider, MD  metoprolol succinate (TOPROL-XL) 25 MG 24 hr tablet Take 25 mg by mouth daily.   Yes Historical Provider, MD  mirtazapine (REMERON) 7.5 MG tablet Take 7.5 mg by mouth at bedtime.   Yes Historical Provider, MD  NAMENDA 5 MG tablet Take 5 mg by mouth 2 (two) times daily.  11/25/12  Yes Historical  Provider, MD  nitroGLYCERIN (NITROSTAT) 0.4 MG SL tablet Place 0.4 mg under the tongue every 5 (five) minutes as needed.   Yes Historical Provider, MD  oxyCODONE-acetaminophen (PERCOCET/ROXICET) 5-325 MG per tablet Take 1 tablet by mouth every 6 (six) hours as needed for severe pain.   Yes Historical Provider, MD  rivastigmine (EXELON) 9.5 mg/24hr Place 9.5 mg onto the skin daily.   Yes Historical Provider, MD  sertraline (ZOLOFT) 100 MG tablet Take 100 mg by mouth daily.   Yes Historical Provider, MD  sitaGLIPtin (JANUVIA) 50 MG tablet Take 50 mg by mouth daily.   Yes Historical Provider, MD   BP 130/61  Pulse 63  Temp(Src) 97.5 F (36.4 C) (Oral)  Resp 14  SpO2 94% Physical Exam  Constitutional: He is oriented to person, place, and time. He appears well-developed and well-nourished. No  distress.  HENT:  Head: Normocephalic and atraumatic.  Mouth/Throat: Oropharynx is clear and moist.  Eyes: EOM are normal.  Neck: Neck supple. No JVD present.  Cardiovascular: Normal rate, regular rhythm, normal heart sounds and intact distal pulses.   Pulmonary/Chest: Effort normal. He has wheezes. He has rales (RLL).  Abdominal: Soft. He exhibits no distension. There is no tenderness.  Musculoskeletal: Normal range of motion. He exhibits no edema.  Neurological: He is alert and oriented to person, place, and time. No cranial nerve deficit.  Skin: Skin is warm and dry.  Psychiatric: His behavior is normal.    ED Course  Procedures   Labs Review Labs Reviewed  PRO B NATRIURETIC PEPTIDE - Abnormal; Notable for the following:    Pro B Natriuretic peptide (BNP) 16031.0 (*)    All other components within normal limits  CBC WITH DIFFERENTIAL - Abnormal; Notable for the following:    RBC 3.62 (*)    Hemoglobin 9.8 (*)    HCT 32.1 (*)    RDW 16.0 (*)    Neutrophils Relative % 82 (*)    Lymphocytes Relative 6 (*)    Lymphs Abs 0.5 (*)    All other components within normal limits  BASIC METABOLIC  PANEL - Abnormal; Notable for the following:    Potassium 3.5 (*)    Glucose, Bld 120 (*)    BUN 118 (*)    Creatinine, Ser 3.27 (*)    GFR calc non Af Amer 16 (*)    GFR calc Af Amer 18 (*)    Anion gap 18 (*)    All other components within normal limits  I-STAT TROPOININ, ED - Abnormal; Notable for the following:    Troponin i, poc 0.15 (*)    All other components within normal limits  TROPONIN I    Imaging Review Dg Chest 2 View  11/04/2013   CLINICAL DATA:  Shortness of breath for the last week with weakness, history of hypertension and diabetes  EXAM: CHEST  2 VIEW  COMPARISON:  01/10/2010  FINDINGS: Mild cardiac enlargement. Extensive calcification of the aortic arch and descending aorta. Two lead cardiac pacer stable.  Old right rib fractures.  Right lung is clear.  On the left, numerous old rib fractures also identified. There is retrocardiac opacification with blunting of the left costophrenic angle, both new when compared to the prior study.  IMPRESSION: Retrocardiac left lower lobe opacity represents a combination of pleural effusion and consolidation. Pneumonia is not excluded.   Electronically Signed   By: Esperanza Heir M.D.   On: 11/04/2013 21:59     EKG Interpretation   Date/Time:  Saturday November 04 2013 20:41:45 EDT Ventricular Rate:  64 PR Interval:    QRS Duration: 194 QT Interval:  532 QTC Calculation: 549 R Axis:   -89 Text Interpretation:  Junctional rhythm Nonspecific IVCD with LAD  Anterolateral infarct, age indeterminate No significant change since last  tracing Confirmed by YAO  MD, DAVID ([id number]) on 11/04/2013 9:43:35 PM      MDM   Final diagnoses:  Congestive heart failure, unspecified congestive heart failure chronicity, unspecified congestive heart failure type  CKD (chronic kidney disease) stage 4, GFR 15-29 ml/min  SOB (shortness of breath)  Acute pulmonary edema   80 yo M presents with SOB x 1 week. AF, VSS. No hypoxia on room air.  Crackles and faint wheezes in bases. Cardiac exam unremarkable. Wide complex, paced rhythm on EKG, no ischemic changes. CXR demonstrates R  pleural effusion. Gave lasix 40 mg IV. iStat trop pos. Repeat negative. Gave ASA 325 mg. Creatinine 3 consistent with h/o CKD stage 3. BNP 16000.  Will admit for pulmonary edema and IV diuresis.  Patient remains stable in ED. Transferred to floor in stable condition.  Case discussed with Dr. Silverio Lay.   Maris Berger, MD 11/05/13 0000

## 2013-11-04 NOTE — ED Notes (Signed)
[facility]. Pt. Has been having weakness for the past week and SOB. Denies pain. He was given Albuterol 5mg . X 1.

## 2013-11-04 NOTE — ED Notes (Signed)
Dr Silverio LayYao given a copy of troponin results .15

## 2013-11-05 DIAGNOSIS — J810 Acute pulmonary edema: Secondary | ICD-10-CM

## 2013-11-05 DIAGNOSIS — R0602 Shortness of breath: Secondary | ICD-10-CM | POA: Diagnosis present

## 2013-11-05 DIAGNOSIS — I5022 Chronic systolic (congestive) heart failure: Secondary | ICD-10-CM

## 2013-11-05 DIAGNOSIS — D500 Iron deficiency anemia secondary to blood loss (chronic): Secondary | ICD-10-CM | POA: Diagnosis present

## 2013-11-05 DIAGNOSIS — I5023 Acute on chronic systolic (congestive) heart failure: Secondary | ICD-10-CM | POA: Diagnosis present

## 2013-11-05 LAB — GLUCOSE, CAPILLARY
GLUCOSE-CAPILLARY: 109 mg/dL — AB (ref 70–99)
Glucose-Capillary: 130 mg/dL — ABNORMAL HIGH (ref 70–99)
Glucose-Capillary: 95 mg/dL (ref 70–99)
Glucose-Capillary: 138 mg/dL — ABNORMAL HIGH (ref 70–99)
Glucose-Capillary: 109 mg/dL — ABNORMAL HIGH (ref 70–99)

## 2013-11-05 LAB — HEMOGLOBIN A1C
HEMOGLOBIN A1C: 8.4 % — AB (ref <5.7)
Mean Plasma Glucose: 194 mg/dL — ABNORMAL HIGH (ref <117)

## 2013-11-05 MED ORDER — METOLAZONE 2.5 MG PO TABS
2.5000 mg | ORAL_TABLET | Freq: Two times a day (BID) | ORAL | Status: AC
  Administered 2013-11-05 – 2013-11-06 (×4): 2.5 mg via ORAL
  Filled 2013-11-05 (×4): qty 1

## 2013-11-05 MED ORDER — SODIUM CHLORIDE 0.9 % IV SOLN: 250.0000 mL | INTRAVENOUS | Status: DC | PRN

## 2013-11-05 MED ORDER — ENOXAPARIN SODIUM 30 MG/0.3ML ~~LOC~~ SOLN
30.0000 mg | SUBCUTANEOUS | Status: DC
  Administered 2013-11-05 – 2013-11-08 (×4): 30 mg via SUBCUTANEOUS
  Filled 2013-11-05 (×5): qty 0.3

## 2013-11-05 MED ORDER — FERROUS SULFATE 325 (65 FE) MG PO TABS
325.0000 mg | ORAL_TABLET | Freq: Two times a day (BID) | ORAL | Status: DC
Start: 2013-11-05 — End: 2013-11-08
  Administered 2013-11-05 – 2013-11-08 (×7): 325 mg via ORAL
  Filled 2013-11-05 (×9): qty 1

## 2013-11-05 MED ORDER — FUROSEMIDE 10 MG/ML IJ SOLN
80.0000 mg | Freq: Two times a day (BID) | INTRAMUSCULAR | Status: AC
  Administered 2013-11-05 – 2013-11-06 (×4): 80 mg via INTRAVENOUS
  Filled 2013-11-05 (×5): qty 8

## 2013-11-05 MED ORDER — ACETAMINOPHEN 325 MG PO TABS: 650.0000 mg | ORAL_TABLET | ORAL | Status: DC | PRN

## 2013-11-05 MED ORDER — SODIUM CHLORIDE 0.9 % IJ SOLN: 3.0000 mL | INTRAMUSCULAR | Status: DC | PRN

## 2013-11-05 MED ORDER — ONDANSETRON HCL 4 MG/2ML IJ SOLN: 4.0000 mg | Freq: Four times a day (QID) | INTRAMUSCULAR | Status: DC | PRN

## 2013-11-05 MED ORDER — INSULIN ASPART 100 UNIT/ML ~~LOC~~ SOLN
0.0000 [IU] | Freq: Three times a day (TID) | SUBCUTANEOUS | Status: DC
  Administered 2013-11-05: 1 [IU] via SUBCUTANEOUS
  Administered 2013-11-06: 2 [IU] via SUBCUTANEOUS
  Administered 2013-11-06 – 2013-11-07 (×3): 1 [IU] via SUBCUTANEOUS
  Administered 2013-11-07 – 2013-11-08 (×3): 2 [IU] via SUBCUTANEOUS

## 2013-11-05 MED ORDER — CALCIUM CARBONATE 1250 (500 CA) MG PO TABS
1.0000 | ORAL_TABLET | Freq: Every day | ORAL | Status: DC
  Administered 2013-11-05 – 2013-11-08 (×4): 500 mg via ORAL
  Filled 2013-11-05 (×5): qty 1

## 2013-11-05 MED ORDER — INSULIN ASPART 100 UNIT/ML ~~LOC~~ SOLN: 0.0000 [IU] | Freq: Every day | SUBCUTANEOUS | Status: DC

## 2013-11-05 MED ORDER — MIRTAZAPINE 7.5 MG PO TABS
7.5000 mg | ORAL_TABLET | Freq: Every day | ORAL | Status: DC
  Administered 2013-11-05 – 2013-11-07 (×4): 7.5 mg via ORAL
  Filled 2013-11-05 (×5): qty 1

## 2013-11-05 MED ORDER — SERTRALINE HCL 100 MG PO TABS
100.0000 mg | ORAL_TABLET | Freq: Every day | ORAL | Status: DC
  Administered 2013-11-05 – 2013-11-08 (×4): 100 mg via ORAL
  Filled 2013-11-05 (×4): qty 1

## 2013-11-05 MED ORDER — POTASSIUM CHLORIDE CRYS ER 10 MEQ PO TBCR
10.0000 meq | EXTENDED_RELEASE_TABLET | Freq: Every day | ORAL | Status: AC
  Administered 2013-11-05 – 2013-11-06 (×2): 10 meq via ORAL
  Filled 2013-11-05 (×2): qty 1

## 2013-11-05 MED ORDER — PANTOPRAZOLE SODIUM 40 MG PO TBEC
40.0000 mg | DELAYED_RELEASE_TABLET | Freq: Every day | ORAL | Status: DC
  Administered 2013-11-05 – 2013-11-08 (×4): 40 mg via ORAL
  Filled 2013-11-05 (×4): qty 1

## 2013-11-05 MED ORDER — METOPROLOL SUCCINATE ER 25 MG PO TB24
25.0000 mg | ORAL_TABLET | Freq: Every day | ORAL | Status: DC
  Administered 2013-11-05 – 2013-11-08 (×4): 25 mg via ORAL
  Filled 2013-11-05 (×4): qty 1

## 2013-11-05 MED ORDER — MOMETASONE FURO-FORMOTEROL FUM 100-5 MCG/ACT IN AERO
2.0000 | INHALATION_SPRAY | Freq: Two times a day (BID) | RESPIRATORY_TRACT | Status: DC
  Administered 2013-11-05 – 2013-11-08 (×6): 2 via RESPIRATORY_TRACT
  Filled 2013-11-05 (×2): qty 8.8

## 2013-11-05 MED ORDER — SODIUM CHLORIDE 0.9 % IJ SOLN
3.0000 mL | Freq: Two times a day (BID) | INTRAMUSCULAR | Status: DC
  Administered 2013-11-05 – 2013-11-08 (×8): 3 mL via INTRAVENOUS

## 2013-11-05 MED ORDER — RIVASTIGMINE 9.5 MG/24HR TD PT24
9.5000 mg | MEDICATED_PATCH | Freq: Every day | TRANSDERMAL | Status: DC
  Administered 2013-11-05 – 2013-11-08 (×4): 9.5 mg via TRANSDERMAL
  Filled 2013-11-05 (×4): qty 1

## 2013-11-05 MED ORDER — MEMANTINE HCL 5 MG PO TABS
5.0000 mg | ORAL_TABLET | Freq: Two times a day (BID) | ORAL | Status: DC
Start: 2013-11-05 — End: 2013-11-08
  Administered 2013-11-05 – 2013-11-08 (×8): 5 mg via ORAL
  Filled 2013-11-05 (×9): qty 1

## 2013-11-05 NOTE — Plan of Care (Addendum)
Problem: Phase I Progression Outcomes Goal: Dyspnea controlled at rest (HF) Outcome: Not Progressing Short of breath with minimal exertion.     [id number] Son brought in clients' glasses, hearing aids, and IPAD.  This was not with him upon admission.

## 2013-11-05 NOTE — Progress Notes (Signed)
[facility] PROGRESS NOTE Interim History: 80 y.o. male with a history of Chronic systolic CHF, CAD S/P PCI with Stent x 3, PAF, HTN, DM2, CKD stage IV, and Iron deficiency Anemia who presents to the ED with complaints of worsening SOB x 1 week. He denies fevers or chills or cough and chest pain. He was evaluated in the ED and was found to [place] 160[address].0, and A LLL opacity and pleural effusion on chest X-ray.   Filed Weights   11/04/13 2352 11/05/13 0524  Weight: 89.812 kg (198 lb) 87.907 kg (193 lb 12.8 oz)        Intake/Output Summary (Last 24 hours) at 11/05/13 0856 Last data filed at 11/05/13 0525  Gross per 24 hour  Intake     60 ml  Output    450 ml  Net   -390 ml    Assessment/Plan: Acute on chronic systolic heart failure: - Echo is pending.+JVD 3+ lower ext edema. - Started on IV lasix, mod diuresis. Monitor electrolytes. - Unknown estimated dry weight.  Anemia due to chronic blood loss: - Cont iron supplementation. - No hbg to compare it with except for one in 2013.  CKD (chronic kidney disease) stage 4, GFR 15-29 ml/min: - Unknown baseline cr.  PAF (paroxysmal atrial fibrillation): - no events  DM (diabetes mellitus), type 2 - On SSI. A1c pending.   Code Status: DO NOT RESUSCITATE  Family Communication: Son at Bedside  Disposition Plan: Inpatient    Consultants:  none  Procedures: ECHO: pending  Antibiotics:  None  HPI/Subjective: Relates his SOB is unchanged.  Objective: Filed Vitals:   11/04/13 2215 11/04/13 2230 11/04/13 2352 11/05/13 0524  BP: 130/61 127/60 145/74 130/59  Pulse: 63 61 69 64  Temp:   97.6 F (36.4 C) 97.7 F (36.5 C)  TempSrc:   Oral Oral  Resp: 14 15 18 18   Height:   5\' 10"  (1.778 m)   Weight:   89.812 kg (198 lb) 87.907 kg (193 lb 12.8 oz)  SpO2: 94% 94% 100% 99%     Exam:  General: Alert, awake, oriented x3, in no acute distress.  HEENT: No bruits, no goiter. +JVD Heart: Regular rate and  rhythm,  Lungs: Good air movement, clear Abdomen: Soft, nontender, nondistended, positive bowel sounds.    Data Reviewed: Basic Metabolic Panel:  Recent Labs Lab 11/04/13 2014  NA 141  K 3.5*  CL 99  CO2 24  GLUCOSE 120*  BUN 118*  CREATININE 3.27*  CALCIUM 8.6   Liver Function Tests: No results found for this basename: AST, ALT, ALKPHOS, BILITOT, PROT, ALBUMIN,  in the last 168 hours No results found for this basename: LIPASE, AMYLASE,  in the last 168 hours No results found for this basename: AMMONIA,  in the last 168 hours CBC:  Recent Labs Lab 11/04/13 2014  WBC 8.4  NEUTROABS 6.9  HGB 9.8*  HCT 32.1*  MCV 88.7  PLT 193   Cardiac Enzymes:  Recent Labs Lab 11/04/13 2044  TROPONINI <0.30   BNP (last 3 results)  Recent Labs  11/04/13 2014  PROBNP 16031.0*   CBG:  Recent Labs Lab 11/05/13 0132  GLUCAP 130*    No results found for this or any previous visit (from the past 240 hour(s)).   Studies: Dg Chest 2 View  11/04/2013   CLINICAL DATA:  Shortness of breath for the last week with weakness, history of hypertension and diabetes  EXAM: CHEST  2  VIEW  COMPARISON:  01/10/2010  FINDINGS: Mild cardiac enlargement. Extensive calcification of the aortic arch and descending aorta. Two lead cardiac pacer stable.  Old right rib fractures.  Right lung is clear.  On the left, numerous old rib fractures also identified. There is retrocardiac opacification with blunting of the left costophrenic angle, both new when compared to the prior study.  IMPRESSION: Retrocardiac left lower lobe opacity represents a combination of pleural effusion and consolidation. Pneumonia is not excluded.   Electronically Signed   By: Esperanza Heiraymond  Rubner M.D.   On: 11/04/2013 21:59    Scheduled Meds: . calcium carbonate  1 tablet Oral Q breakfast  . enoxaparin (LOVENOX) injection  30 mg Subcutaneous Q24H  . ferrous sulfate  325 mg Oral BID WC  . furosemide  80 mg Intravenous Q12H  .  insulin aspart  0-5 Units Subcutaneous QHS  . insulin aspart  0-9 Units Subcutaneous TID WC  . memantine  5 mg Oral BID  . metolazone  2.5 mg Oral BID  . metoprolol succinate  25 mg Oral Daily  . mirtazapine  7.5 mg Oral QHS  . mometasone-formoterol  2 puff Inhalation BID  . pantoprazole  40 mg Oral Daily  . potassium chloride  10 mEq Oral Daily  . rivastigmine  9.5 mg Transdermal Daily  . sertraline  100 mg Oral Daily  . sodium chloride  3 mL Intravenous Q12H   Continuous Infusions:    Marinda ElkFELIZ ORTIZ, Sparkles Mcneely  [facility] Pager [phone] If 8PM-8AM, please contact night-coverage at [email or web address], password [email or web address] 11/05/2013, 8:56 AM  LOS: 1 day

## 2013-11-05 NOTE — H&P (Signed)
[facility] Admission History and Physical       Joseph Hobbs WUJ:[id number] DOB: [birth year] DOA: 11/04/2013  Referring physician:  EDP PCP: Pcp Not In System  Specialists:   Chief Complaint: Worsening SOB  HPI: Joseph Hobbs is a 80 y.o. male with a history of Chronic systolic CHF, CAD S/P PCI with Stent x 3, PAF,  HTN, DM2, CKD stage IV, and Iron deficiency Anemia who presents to the ED with complaints of worsening SOB x 1 week.   He denies fevers or chills or cough and chest pain.   He was evaluated in the ED and was found to have [place]NP of 16031.0, and  A LLL opacity and pleural effusion on chest X-ray.  He was administered IV lasix and referred for medical admission.      Of Note: He has undergone a GI workup this past week with Dr. Ewing Schlein  due to Anemia and had an EGD which revealed Gastritis, and he was placed on BID Protonix Rx, and Iron.   He also has been seen by Dr Marisue Humble of [facility] due to worsening CKD.      Review of Systems:  Constitutional: No Weight Loss, No Weight Gain, Night Sweats, Fevers, Chills, Dizziness, Fatigue, or Generalized Weakness HEENT: No Headaches, Difficulty Swallowing,Tooth/Dental Problems,Sore Throat,  No Sneezing, Rhinitis, Ear Ache, Nasal Congestion, or Post Nasal Drip,  Cardio-vascular:  No Chest pain, Orthopnea, PND, Edema in Lower Extremities, Anasarca, Dizziness, Palpitations  Resp: +Dyspnea, +DOE, No Cough, No Hemoptysis, No Wheezing.    GI: No Heartburn, Indigestion, Abdominal Pain, Nausea, Vomiting, Diarrhea, Hematemesis, Hematochezia, Melena, Change in Bowel Habits,  Loss of Appetite  GU: No Dysuria, Change in Color of Urine, No Urgency or Frequency, No Flank pain.  Musculoskeletal: No Joint Pain or Swelling, No Decreased Range of Motion, No Back Pain.  Neurologic: No Syncope, No Seizures, Muscle Weakness, Paresthesia, Vision Disturbance or Loss, No Diplopia, No Vertigo, No Difficulty Walking,  Skin: No Rash or  Lesions. Psych: No Change in Mood or Affect, No Depression or Anxiety, No Memory loss, No Confusion, or Hallucinations   Past Medical History  Diagnosis Date  . Hypertension   . History of acute myocardial infarction 2000--  S/P STENTS X3  . S/P coronary artery stent placement   . History of CHF (congestive heart failure) POST SURGERY 2011    EF 35-40%  . PAF (paroxysmal atrial fibrillation) POST OP SURGERY 2011  . Hyperlipidemia   . Gait disorder USES WALKER  . Diabetic peripheral neuropathy   . DM (diabetes mellitus), type 2   . GERD (gastroesophageal reflux disease)   . History of esophageal stricture POST DILATION'S  . Urethral stricture   . Urinary incontinence   . Chronic kidney disease (CKD), stage III (moderate)   . History of prostate cancer 1995-- S/P PROSTATECTOMY    NO RECURRENCE  . Arthritis   . Impaired hearing   . Dry skin AT TIMES GETS LESIONS--  CURRENTLY THEY ARE HEALED  . Chronic back pain   . Asymptomatic carotid artery stenosis BILATERAL ICA   -- PER DR SMITH NOTE   . Pacemaker PLACEMENT IN 2000 --  LAST PACER CHECK  07-08-2011  . Coronary artery disease CARDIOLOGIST- DR Verdis Prime-- LOV  06-08-2011--  CURRENT EKG, LAST PACER CHECK , ECHO W/ CHART (NO STRESS TEST )  . Impaired memory DX ALZHEIMER'S IN 2010  . CHF (congestive heart failure)       Past Surgical History  Procedure  Laterality Date  . Prostatectomy  1995    PROSTATE CANCER  . Lumbar disc surgery  X3   LAST ONE 1990  . Coronary angioplasty with stent placement  2000    X3 [place]  [place]  . Neuroplasty / transposition median nerve at carpal tunnel bilateral    . Pilonidal cyst excision    . Tonsillectomy and adenoidectomy    . Partial colectomy/ repair fissure  2011    DIVERTICULITIS  . Transthoracic echocardiogram  01-03-2010  DR Verdis Prime    SEVERE CONCENTRIC LVH/ EF 35-40%/ SEVERE LEFT ATRIAL ENLARGEMENT/ MODERATE MITRAL VALVE REGURG./ GRADE IID DIASTOLIC DYSFUNCTION/  MILD AORTIC  &  TRICUSPID VALVE REGURG  . Cystoscopy with urethral dilatation  11/19/2011    Procedure: CYSTOSCOPY WITH URETHRAL DILATATION;  Surgeon: Martina Sinner, MD;  Location: [facility] [place] [facility];  Service: Urology;  Laterality: N/A;  CYSTO WITH BALLOON DILATION       Prior to Admission medications   Medication Sig Start Date End Date Taking? Authorizing Provider  aspirin 81 MG tablet Take 81 mg by mouth daily.   Yes Historical Provider, MD  calcium carbonate (OS-CAL - DOSED IN MG OF ELEMENTAL CALCIUM) 1250 MG tablet Take 1 tablet by mouth.   Yes Historical Provider, MD  cetaphil (CETAPHIL) cream Apply topically daily.   Yes Historical Provider, MD  Dexlansoprazole 30 MG capsule Take 30 mg by mouth daily.   Yes Historical Provider, MD  ferrous sulfate 325 (65 FE) MG tablet Take 325 mg by mouth 2 (two) times daily with a meal.   Yes Historical Provider, MD  Fluticasone-Salmeterol (ADVAIR) 100-50 MCG/DOSE AEPB Inhale 1 puff into the lungs 2 (two) times daily.   Yes Historical Provider, MD  furosemide (LASIX) 40 MG tablet Take 80 mg by mouth 2 (two) times daily.    Yes Historical Provider, MD  glimepiride (AMARYL) 2 MG tablet Take 2 mg by mouth daily with breakfast.   Yes Historical Provider, MD  HYDROcodone-acetaminophen (NORCO) 7.5-325 MG per tablet Take 1 tablet by mouth every 6 (six) hours as needed for moderate pain.   Yes Historical Provider, MD  isosorbide-hydrALAZINE (BIDIL) 20-37.5 MG per tablet Take 1 tablet by mouth 2 (two) times daily.    Yes Historical Provider, MD  Melatonin 5 MG TABS Take 5 mg by mouth at bedtime.   Yes Historical Provider, MD  metoprolol succinate (TOPROL-XL) 25 MG 24 hr tablet Take 25 mg by mouth daily.   Yes Historical Provider, MD  mirtazapine (REMERON) 7.5 MG tablet Take 7.5 mg by mouth at bedtime.   Yes Historical Provider, MD  NAMENDA 5 MG tablet Take 5 mg by mouth 2 (two) times daily.  11/25/12  Yes Historical Provider, MD  nitroGLYCERIN  (NITROSTAT) 0.4 MG SL tablet Place 0.4 mg under the tongue every 5 (five) minutes as needed.   Yes Historical Provider, MD  oxyCODONE-acetaminophen (PERCOCET/ROXICET) 5-325 MG per tablet Take 1 tablet by mouth every 6 (six) hours as needed for severe pain.   Yes Historical Provider, MD  rivastigmine (EXELON) 9.5 mg/24hr Place 9.5 mg onto the skin daily.   Yes Historical Provider, MD  sertraline (ZOLOFT) 100 MG tablet Take 100 mg by mouth daily.   Yes Historical Provider, MD  sitaGLIPtin (JANUVIA) 50 MG tablet Take 50 mg by mouth daily.   Yes Historical Provider, MD      Allergies  Allergen Reactions  . Penicillins Other (See Comments)    UNKNOWN  . Soap Itching,  Rash and Other (See Comments)    Can tolerate hypoallergenic soap     Social History:  reports that he has never smoked. He has never used smokeless tobacco. He reports that he does not drink alcohol or use illicit drugs.     Family History  Problem Relation Age of Onset  . Heart disease Mother        Physical Exam:  GEN:  Pleasant Elderlly Well Nourished Well Developed  80 y.o. Caucasian male  examined  and in no acute distress; cooperative with exam Filed Vitals:   11/04/13 2158 11/04/13 2215 11/04/13 2230 11/04/13 2352  BP: 142/60 130/61 127/60 145/74  Pulse: 66 63 61 69  Temp:    97.6 F (36.4 C)  TempSrc:    Oral  Resp:  14 15 18   Height:    5\' 10"  (1.778 m)  Weight:    89.812 kg (198 lb)  SpO2: 94% 94% 94% 100%   Blood pressure 145/74, pulse 69, temperature 97.6 F (36.4 C), temperature source Oral, resp. rate 18, height 5\' 10"  (1.778 m), weight 89.812 kg (198 lb), SpO2 100.00%. PSYCH: He is alert and oriented x4; does not appear anxious does not appear depressed; affect is normal HEENT: Normocephalic and Atraumatic, Mucous membranes pink; PERRLA; EOM intact; Fundi:  Benign;  No scleral icterus, Nares: Patent, Oropharynx: Clear, Fair Dentition,    Neck:  FROM, No Cervical Lymphadenopathy nor Thyromegaly or  Carotid Bruit; No JVD; Breasts:: Not examined CHEST WALL: No tenderness CHEST: Normal respiration, clear to auscultation bilaterally HEART: Regular rate and rhythm; no murmurs rubs or gallops BACK: No kyphosis or scoliosis; No CVA tenderness ABDOMEN: Positive Bowel Sounds, Obese, Soft Non-Tender; No Masses, No Organomegaly.   Rectal Exam: Not done EXTREMITIES: No Cyanosis, Clubbing, or Edema; No Ulcerations. Genitalia: not examined PULSES: 2+ and symmetric SKIN: Normal hydration no rash or ulceration CNS:  Alert and Oriented x 3, No focal Deficits  Vascular: pulses palpable throughout    Labs on Admission:  Basic Metabolic Panel:  Recent Labs Lab 11/04/13 2014  NA 141  K 3.5*  CL 99  CO2 24  GLUCOSE 120*  BUN 118*  CREATININE 3.27*  CALCIUM 8.6   Liver Function Tests: No results found for this basename: AST, ALT, ALKPHOS, BILITOT, PROT, ALBUMIN,  in the last 168 hours No results found for this basename: LIPASE, AMYLASE,  in the last 168 hours No results found for this basename: AMMONIA,  in the last 168 hours CBC:  Recent Labs Lab 11/04/13 2014  WBC 8.4  NEUTROABS 6.9  HGB 9.8*  HCT 32.1*  MCV 88.7  PLT 193   Cardiac Enzymes:  Recent Labs Lab 11/04/13 2044  TROPONINI <0.30    BNP (last 3 results)  Recent Labs  11/04/13 2014  PROBNP 16031.0*   CBG: No results found for this basename: GLUCAP,  in the last 168 hours  Radiological Exams on Admission: Dg Chest 2 View  11/04/2013   CLINICAL DATA:  Shortness of breath for the last week with weakness, history of hypertension and diabetes  EXAM: CHEST  2 VIEW  COMPARISON:  01/10/2010  FINDINGS: Mild cardiac enlargement. Extensive calcification of the aortic arch and descending aorta. Two lead cardiac pacer stable.  Old right rib fractures.  Right lung is clear.  On the left, numerous old rib fractures also identified. There is retrocardiac opacification with blunting of the left costophrenic angle, both new  when compared to the prior study.  IMPRESSION: Retrocardiac left  lower lobe opacity represents a combination of pleural effusion and consolidation. Pneumonia is not excluded.   Electronically Signed   By: Esperanza Heir M.D.   On: 11/04/2013 21:59     EKG: Independently reviewed. Atrial Fibrillation at rate of 65   Assessment/Plan:   80 y.o. male with   Principal Problem:   1.   Acute on chronic systolic heart failure   Placed on the Acute CHf Protocol   Diurese with Zaroxyln and IV Lasix BID x 4 doses   Monitor Electrolytes and BUN/Cr   Active Problems:   2.   SOB (shortness of breath)   NCO2 PRN   Monitor O2 Sats    3.   Anemia due to chronic blood loss   Continue Iron Supplement     4.  CKD (chronic kidney disease) stage 4, GFR 15-29 ml/min   Monitor BUN/Cr trend       5.   PAF (paroxysmal atrial fibrillation)- currently rate controlled   Telemetry Monitoring     6.   Hyperlipidemia   Check lipid panel     7.   DM (diabetes mellitus), type 2   Hold Amaryl, and Januvia   SSI coverage   Check HbA1c     8.   Coronary artery disease   Stable    Continue Imdur     9.    DVT Prophylaxis   SCDs     Code Status:     DO NOT RESUSCITATE Family Communication:   Son at Bedside  Disposition Plan:   Inpatient  Time spent:  60 Minutes  Ron Parker [facility] Pager [phone]   If 7AM -7PM Please Contact the Day Rounding Team MD for [facility]  If 7PM-7AM, Please Contact Night-Floor Coverage  [email or web address] Password [email or web address] 11/05/2013, 12:26 AM

## 2013-11-06 DIAGNOSIS — I369 Nonrheumatic tricuspid valve disorder, unspecified: Secondary | ICD-10-CM

## 2013-11-06 LAB — GLUCOSE, CAPILLARY
GLUCOSE-CAPILLARY: 94 mg/dL (ref 70–99)
GLUCOSE-CAPILLARY: 144 mg/dL — AB (ref 70–99)
GLUCOSE-CAPILLARY: 153 mg/dL — AB (ref 70–99)
GLUCOSE-CAPILLARY: 171 mg/dL — AB (ref 70–99)

## 2013-11-06 LAB — BASIC METABOLIC PANEL
Anion gap: 18 — ABNORMAL HIGH (ref 5–15)
BUN: 112 mg/dL — AB (ref 6–23)
CO2: 27 mEq/L (ref 19–32)
Calcium: 8.8 mg/dL (ref 8.4–10.5)
Chloride: 96 mEq/L (ref 96–112)
Creatinine, Ser: 3.25 mg/dL — ABNORMAL HIGH (ref 0.50–1.35)
GFR calc Af Amer: 18 mL/min — ABNORMAL LOW (ref >90)
GFR, EST NON AFRICAN AMERICAN: 16 mL/min — AB (ref >90)
GLUCOSE: 94 mg/dL (ref 70–99)
POTASSIUM: 3 meq/L — AB (ref 3.7–5.3)
Sodium: 141 mEq/L (ref 137–147)

## 2013-11-06 MED ORDER — INSULIN GLARGINE 100 UNIT/ML ~~LOC~~ SOLN
5.0000 [IU] | Freq: Every day | SUBCUTANEOUS | Status: DC
  Filled 2013-11-06: qty 0.05

## 2013-11-06 MED ORDER — INSULIN GLARGINE 100 UNIT/ML ~~LOC~~ SOLN
2.0000 [IU] | Freq: Every day | SUBCUTANEOUS | Status: DC
  Administered 2013-11-06 – 2013-11-07 (×2): 2 [IU] via SUBCUTANEOUS
  Filled 2013-11-06 (×3): qty 0.02

## 2013-11-06 MED ORDER — POTASSIUM CHLORIDE CRYS ER 20 MEQ PO TBCR
40.0000 meq | EXTENDED_RELEASE_TABLET | Freq: Two times a day (BID) | ORAL | Status: DC
  Administered 2013-11-06 – 2013-11-08 (×4): 40 meq via ORAL
  Filled 2013-11-06 (×6): qty 2

## 2013-11-06 MED ORDER — OXYCODONE-ACETAMINOPHEN 5-325 MG PO TABS
2.0000 | ORAL_TABLET | Freq: Once | ORAL | Status: AC
  Administered 2013-11-06: 2 via ORAL

## 2013-11-06 MED ORDER — POTASSIUM CHLORIDE CRYS ER 20 MEQ PO TBCR
40.0000 meq | EXTENDED_RELEASE_TABLET | Freq: Once | ORAL | Status: AC
  Administered 2013-11-06: 40 meq via ORAL

## 2013-11-06 MED ORDER — OXYCODONE-ACETAMINOPHEN 5-325 MG PO TABS
1.0000 | ORAL_TABLET | Freq: Four times a day (QID) | ORAL | Status: DC | PRN
  Administered 2013-11-07 (×2): 1 via ORAL
  Filled 2013-11-06 (×4): qty 1

## 2013-11-06 MED ORDER — OXYCODONE-ACETAMINOPHEN 5-325 MG PO TABS
1.0000 | ORAL_TABLET | Freq: Once | ORAL | Status: AC
  Administered 2013-11-06: 1 via ORAL
  Filled 2013-11-06 (×2): qty 1

## 2013-11-06 NOTE — Progress Notes (Signed)
  Echocardiogram 2D Echocardiogram has been performed.  Joseph Hobbs 11/06/2013, 10:03 AM

## 2013-11-06 NOTE — Progress Notes (Signed)
Inpatient Diabetes Program Recommendations  AACE/ADA: New Consensus Statement on Inpatient Glycemic Control (2013)  Target Ranges:  Prepandial:   less than 140 mg/dL      Peak postprandial:   less than 180 mg/dL (1-2 hours)      Critically ill patients:  140 - 180 mg/dL   This coordinator met with patient to discuss insulin at home vs. lifestyle intervention.  Pt prefers not to go on insulin at this time. States he does not monitor his glucose like he should and sometimes drinks regular soda instead of diet.  CBGs have been 94-144 since admission.  Encouraged patient to monitor CBGs at minimum BID and be cautious with dietary intake of carbohydrates.   No questions/concerns at the end of our visit. Thank you  Raoul Pitch BSN, RN,CDE Inpatient Diabetes Coordinator [phone] (team pager)

## 2013-11-06 NOTE — ED Provider Notes (Signed)
I saw and evaluated the patient, reviewed the resident's note and I agree with the findings and plan.   EKG Interpretation   Date/Time:  Saturday November 04 2013 20:41:45 EDT Ventricular Rate:  64 PR Interval:    QRS Duration: 194 QT Interval:  532 QTC Calculation: 549 R Axis:   -89 Text Interpretation:  Junctional rhythm Nonspecific IVCD with LAD  Anterolateral infarct, age indeterminate No significant change since last  tracing Confirmed by Satoshi Kalas  MD, Kristelle Cavallaro ([id number]) on 11/04/2013 9:43:35 PM      Bernerd LimboJohn Saetern is a 80 y.o. male here with SOB. Hx of CHF, CKD and has pacemaker. On exam, crackles bilateral bases. 2+ edema bilateral legs. EKG paced. CXR showed R pleural effusion. istat trop positive, given ASA but lab trop neg. BNP 16000, given lasix. Will admit for CHF.    Richardean Canalavid H Maze Corniel, MD 11/06/13 [id number]

## 2013-11-06 NOTE — Progress Notes (Addendum)
[facility] PROGRESS NOTE Interim History: 80 y.o. male with a history of Chronic systolic CHF, CAD S/P PCI with Stent x 3, PAF, HTN, DM2, CKD stage IV, and Iron deficiency Anemia who presents to the ED with complaints of worsening SOB x 1 week. He denies fevers or chills or cough and chest pain. He was evaluated in the ED and was found to [place] 16031.0, and A LLL opacity and pleural effusion on chest X-ray.   Filed Weights   11/05/13 0524 11/06/13 0619 11/06/13 0752  Weight: 87.907 kg (193 lb 12.8 oz) 90.402 kg (199 lb 4.8 oz) 84.9 kg (187 lb 2.7 oz)        Intake/Output Summary (Last 24 hours) at 11/06/13 1029 Last data filed at 11/06/13 0858  Gross per 24 hour  Intake    480 ml  Output   2550 ml  Net  -2070 ml    Assessment/Plan: Acute on chronic systolic heart failure: - Echo is pending.+JVD, 2+ lower ext edema, place ted hose. - Good UOP, 87.9 -> 84.9 kg. - Cont on IV lasixs. Monitor electrolytes, daily weights. - Consult PT.  Anemia due to chronic blood loss: - Cont iron supplementation. - No hbg to compare it with except for one in 2013.  CKD (chronic kidney disease) stage 4, GFR 15-29 ml/min: - GFR 18.  PAF (paroxysmal atrial fibrillation): - no events.  DM (diabetes mellitus), type 2 - On SSI. A1c 8.4, will need to add insulin to his home regimen. - Diabetes education.   Code Status: DO NOT RESUSCITATE  Family Communication: Son at Bedside  Disposition Plan: Inpatient   Consultants:  none  Procedures: ECHO: pending  Antibiotics:  None  HPI/Subjective: Relates his SOB is improved  Objective: Filed Vitals:   11/05/13 1300 11/05/13 2019 11/06/13 0619 11/06/13 0752  BP: 132/56 138/60 137/66   Pulse: 64 68 70   Temp: 97.1 F (36.2 C) 97.6 F (36.4 C) 97.6 F (36.4 C)   TempSrc: Oral Oral Oral   Resp: 20 20 18    Height:      Weight:   90.402 kg (199 lb 4.8 oz) 84.9 kg (187 lb 2.7 oz)  SpO2: 100% 97% 98%       Exam: General: Alert, awake, oriented x3, in no acute distress.  HEENT: No bruits, no goiter. +JVD Heart: Regular rate and rhythm,  Lungs: Good air movement, clear Abdomen: Soft, nontender, nondistended, positive bowel sounds.    Data Reviewed: Basic Metabolic Panel:  Recent Labs Lab 11/04/13 2014 11/06/13 0312  NA 141 141  K 3.5* 3.0*  CL 99 96  CO2 24 27  GLUCOSE 120* 94  BUN 118* 112*  CREATININE 3.27* 3.25*  CALCIUM 8.6 8.8   Liver Function Tests: No results found for this basename: AST, ALT, ALKPHOS, BILITOT, PROT, ALBUMIN,  in the last 168 hours No results found for this basename: LIPASE, AMYLASE,  in the last 168 hours No results found for this basename: AMMONIA,  in the last 168 hours CBC:  Recent Labs Lab 11/04/13 2014  WBC 8.4  NEUTROABS 6.9  HGB 9.8*  HCT 32.1*  MCV 88.7  PLT 193   Cardiac Enzymes:  Recent Labs Lab 11/04/13 2044  TROPONINI <0.30   BNP (last 3 results)  Recent Labs  11/04/13 2014  PROBNP 16031.0*   CBG:  Recent Labs Lab 11/05/13 0640 11/05/13 1159 11/05/13 1603 11/05/13 2143 11/06/13 0625  GLUCAP 95 109* 138* 109* 94  No results found for this or any previous visit (from the past 240 hour(s)).   Studies: Dg Chest 2 View  11/04/2013   CLINICAL DATA:  Shortness of breath for the last week with weakness, history of hypertension and diabetes  EXAM: CHEST  2 VIEW  COMPARISON:  01/10/2010  FINDINGS: Mild cardiac enlargement. Extensive calcification of the aortic arch and descending aorta. Two lead cardiac pacer stable.  Old right rib fractures.  Right lung is clear.  On the left, numerous old rib fractures also identified. There is retrocardiac opacification with blunting of the left costophrenic angle, both new when compared to the prior study.  IMPRESSION: Retrocardiac left lower lobe opacity represents a combination of pleural effusion and consolidation. Pneumonia is not excluded.   Electronically Signed   By:  Esperanza Heiraymond  Rubner M.D.   On: 11/04/2013 21:59    Scheduled Meds: . calcium carbonate  1 tablet Oral Q breakfast  . enoxaparin (LOVENOX) injection  30 mg Subcutaneous Q24H  . ferrous sulfate  325 mg Oral BID WC  . furosemide  80 mg Intravenous Q12H  . insulin aspart  0-5 Units Subcutaneous QHS  . insulin aspart  0-9 Units Subcutaneous TID WC  . memantine  5 mg Oral BID  . metoprolol succinate  25 mg Oral Daily  . mirtazapine  7.5 mg Oral QHS  . mometasone-formoterol  2 puff Inhalation BID  . pantoprazole  40 mg Oral Daily  . potassium chloride  40 mEq Oral BID  . rivastigmine  9.5 mg Transdermal Daily  . sertraline  100 mg Oral Daily  . sodium chloride  3 mL Intravenous Q12H   Continuous Infusions:    Marinda ElkFELIZ ORTIZ, ABRAHAM  [facility] Pager [phone] If 8PM-8AM, please contact night-coverage at [email or web address], password [email or web address] 11/06/2013, 10:29 AM  LOS: 2 days

## 2013-11-07 LAB — BASIC METABOLIC PANEL
ANION GAP: 17 — AB (ref 5–15)
BUN: 116 mg/dL — ABNORMAL HIGH (ref 6–23)
CHLORIDE: 99 meq/L (ref 96–112)
CO2: 28 mEq/L (ref 19–32)
CREATININE: 3.34 mg/dL — AB (ref 0.50–1.35)
Calcium: 8.9 mg/dL (ref 8.4–10.5)
GFR calc non Af Amer: 15 mL/min — ABNORMAL LOW (ref >90)
GFR, EST AFRICAN AMERICAN: 18 mL/min — AB (ref >90)
Glucose, Bld: 109 mg/dL — ABNORMAL HIGH (ref 70–99)
Potassium: 4 mEq/L (ref 3.7–5.3)
SODIUM: 144 meq/L (ref 137–147)

## 2013-11-07 LAB — GLUCOSE, CAPILLARY
Glucose-Capillary: 130 mg/dL — ABNORMAL HIGH (ref 70–99)
Glucose-Capillary: 186 mg/dL — ABNORMAL HIGH (ref 70–99)
Glucose-Capillary: 146 mg/dL — ABNORMAL HIGH (ref 70–99)
Glucose-Capillary: 170 mg/dL — ABNORMAL HIGH (ref 70–99)

## 2013-11-07 MED ORDER — ASPIRIN 81 MG PO CHEW
81.0000 mg | CHEWABLE_TABLET | Freq: Every day | ORAL | Status: DC
  Administered 2013-11-07 – 2013-11-08 (×2): 81 mg via ORAL
  Filled 2013-11-07 (×2): qty 1

## 2013-11-07 MED ORDER — FUROSEMIDE 80 MG PO TABS
80.0000 mg | ORAL_TABLET | Freq: Two times a day (BID) | ORAL | Status: DC
  Administered 2013-11-07 – 2013-11-08 (×3): 80 mg via ORAL
  Filled 2013-11-07 (×5): qty 1

## 2013-11-07 MED ORDER — FUROSEMIDE 10 MG/ML IJ SOLN
20.0000 mg | Freq: Once | INTRAMUSCULAR | Status: AC
Start: 2013-11-07 — End: 2013-11-07
  Administered 2013-11-07: 20 mg via INTRAVENOUS

## 2013-11-07 NOTE — Evaluation (Signed)
Physical Therapy Evaluation Patient Details Name: Joseph Hobbs MRN: [id number] DOB: 1924-10-03 Today's Date: 11/07/2013   History of Present Illness   Joseph Hobbs is a 80 y.o. male with a history of Chronic systolic CHF, CAD S/P PCI with Stent x 3, PAF,  HTN, DM2, CKD stage IV, and Iron deficiency Anemia who presents to the ED with complaints of worsening SOB x 1 week. Pt has exacerbation of his CHF with baseline Alz and lives with his wife at [place].  PLOF was rollator gait and no recently recalled falls.   Clinical Impression  Pt was seen for determination of care level at [place], his home.  Pt is definitely going to need inpt therapy in SNF as he is not currently safely mobile.  Plan is made to allow him to transition there per case manager, who is handling the request to be admitted there.    Follow Up Recommendations SNF;Supervision/Assistance - 24 hour    Equipment Recommendations  None recommended by PT    Recommendations for Other Services Other (comment)     Precautions / Restrictions Precautions Precautions: Fall;ICD/Pacemaker Restrictions Weight Bearing Restrictions: No      Mobility  Bed Mobility Overal bed mobility: Needs Assistance Bed Mobility: Rolling;Supine to Sit Rolling: Min assist   Supine to sit: Mod assist     General bed mobility comments: Pt followed cues for use of bedrail  Transfers Overall transfer level: Needs assistance Equipment used: Rolling walker (2 wheeled) Transfers: Sit to/from [facility] Sit to Stand: Min assist Stand pivot transfers: Min assist       General transfer comment: Pt required cues for correct hand placement to assist standing  Ambulation/Gait Ambulation/Gait assistance: Min assist Ambulation Distance (Feet): 60 Feet Assistive device: Rolling walker (2 wheeled) Gait Pattern/deviations: Step-through pattern;Decreased step length - right;Decreased step length - left;Decreased stride  length;Decreased dorsiflexion - right;Decreased dorsiflexion - left;Trunk flexed;Wide base of support;Drifts right/left Gait velocity: rduced Gait velocity interpretation: Below normal speed for age/gender General Gait Details: Wide turns used, cues for safety and direction/planning of confined spaces and obstacles  Stairs            Wheelchair Mobility    Modified Rankin (Stroke Patients Only)       Balance Overall balance assessment: Needs assistance Sitting-balance support: Feet supported Sitting balance-Leahy Scale: Fair   Postural control: Posterior lean Standing balance support: Bilateral upper extremity supported Standing balance-Leahy Scale: Poor Standing balance comment: needs contact and cues for safety with  walker                             Pertinent Vitals/Pain Pain Assessment: No/denies pain BP was 121/58, pulse 60 and O2 sats 94% per nsg notes.    Home Living Family/patient expects to be discharged to:: Assisted living               Home Equipment: Walker - 4 wheels;Other (comment) (Pt unable to elaborate reliably on his equipment)      Prior Function Level of Independence: Independent with assistive device(s)         Comments: Not known if pt received assistance for bathing and dressing, but may have  as he lived in [place].     Hand Dominance   Dominant Hand: Right    Extremity/Trunk Assessment   Upper Extremity Assessment: Overall WFL for tasks assessed           Lower Extremity Assessment: Generalized weakness  Cervical / Trunk Assessment: Normal  Communication   Communication: No difficulties  Cognition Arousal/Alertness: Awake/alert Behavior During Therapy: WFL for tasks assessed/performed Overall Cognitive Status: Within Functional Limits for tasks assessed       Memory: Decreased short-term memory              General Comments General comments (skin integrity, edema, etc.): Pt has some mild LE  edema with TEDS ordered but not currently on his legs.  Pt is unable to recall safety information other than to ask for help to go back to bed.    Exercises General Exercises - Lower Extremity Long Arc Quad: Strengthening;Both;10 reps Heel Slides: Strengthening;Both;10 reps Hip ABduction/ADduction: Strengthening;Both;10 reps      Assessment/Plan    PT Assessment All further PT needs can be met in the next venue of care  PT Diagnosis Abnormality of gait   PT Problem List Decreased strength;Decreased range of motion;Decreased activity tolerance;Decreased balance;Decreased mobility;Decreased coordination;Decreased cognition;Decreased knowledge of use of DME;Decreased safety awareness;Cardiopulmonary status limiting activity;Decreased skin integrity  PT Treatment Interventions     PT Goals (Current goals can be found in the Care Plan section) Acute Rehab PT Goals Patient Stated Goal: To go home PT Goal Formulation: With patient Time For Goal Achievement: 12/03/2013 Potential to Achieve Goals: Good    Frequency     Barriers to discharge Decreased caregiver support Wife is not in good health    Co-evaluation               End of Session Equipment Utilized During Treatment: Gait belt Activity Tolerance: Patient limited by fatigue Patient left: in chair;with call bell/phone within reach;with chair alarm set Nurse Communication: Mobility status;Other (comment) (discharge planning)         Time: 9847-3085 PT Time Calculation (min): 24 min   Charges:   PT Evaluation $Initial PT Evaluation Tier I: 1 Procedure PT Treatments $Gait Training: 8-22 mins   PT G Codes:          Ramond Dial Dec 06, 2013, 12:19 PM Mee Hives, PT MS Acute Rehab Dept. Number: [phone]

## 2013-11-07 NOTE — Clinical Social Work Psychosocial (Addendum)
Clinical Social Work Department BRIEF PSYCHOSOCIAL ASSESSMENT 11/07/2013  Patient:  Joseph Hobbs, Joseph Hobbs     Account Number:  [id number]     Admit date:  11/04/2013  Clinical Social Worker:  Elam Dutch  Date/Time:  11/07/2013 02:16 AM  Referred by:  Physician  Date Referred:  11/07/2013 Referred for  SNF Placement   Other Referral:   Interview type:  Patient Other interview type:    PSYCHOSOCIAL DATA Living Status:  FACILITY Admitted from facility:  [facility] Level of care:  Assisted Living Primary support name:  Dr. Donnie Coffin [phone] Primary support relationship to patient:  CHILD, ADULT Degree of support available:   Strong    CURRENT CONCERNS Current Concerns  Post-Acute Placement   Other Concerns:    SOCIAL WORK ASSESSMENT / PLAN CSW met with patient today. 80 year old resident of [facility] where is is a resident of their assisted living until.  SW spoke with Pt about returning back to [place] for skilled level of care for short term and patient was agreeable. Physical therapy recommended SNF.  Fl2 initiated and placed on chart. Attempting to reach [place] - Admissions at [facility] to determine bed availibility.  Per MD- anticipate d/c to SNF tomorrow.   Assessment/plan status:  Psychosocial Support/Ongoing Assessment of Needs Other assessment/ plan:   Information/referral to community resources:   SW will Solicitor and notify them that the PT needs skilled level of care and will be returning.    PATIENT'S/FAMILY'S RESPONSE TO PLAN OF CARE: Pt was agreeable to return to [facility] for rehab. Pt seen by Physical Therapy who are recommending SNF level of patient. He feels that this is the best course for him. Agrees to have his son notified of above.  CSW will assist with SNF placement at [facility] when medically stable.   [facility] [place], [place] Intern,  [phone]    Above note reviewed and approved.  Lorie Phenix. Pauline Good,  [place] Education officer, museum

## 2013-11-07 NOTE — Progress Notes (Signed)
CSW [facility] this afternoon. She stated that she would have a SNF bed ready for patient when he is stable for d/c.  CSW will discuss with MD in the morning.  Lorri Frederickonna T. Jaci LazierCrowder, [place]CSW [phone]

## 2013-11-07 NOTE — Care Management Note (Signed)
    Page 1 of 1   11/07/2013     2:24:50 PM CARE MANAGEMENT NOTE 11/07/2013  Patient:  Joseph LimboMITCHELL,Jahmad   Account Number:  [id number]  Date Initiated:  11/07/2013  Documentation initiated by:  [facility]Margarito Dehaas  Subjective/Objective Assessment:   80 y.o. male with a history of Chronic systolic CHF, CAD S/P PCI with Stent x 3, PAF,  HTN, DM2, CKD stage IV, and Iron deficiency Anemia who presents to the ED with complaints of worsening SOB x 1 week.//From [facility].     Action/Plan:   IV lasix.//Assist with return to [facility].   Anticipated DC Date:  11/07/2013   Anticipated DC Plan:  SKILLED NURSING FACILITY  In-house referral  Clinical Social Worker         Choice offered to / List presented to:             Status of service:  Completed, signed off Medicare Important Message given?  YES (If response is "NO", the following Medicare IM given date fields will be blank) Date Medicare IM given:  11/07/2013 Medicare IM given by:  [facility]Shizuk[facility] Wojdyla Date Additional Medicare IM given:   Additional Medicare IM given by:    Discharge Disposition:    Per UR Regulation:    If discussed at Long Length of Stay Meetings, dates discussed:    Comments:

## 2013-11-07 NOTE — Progress Notes (Signed)
[facility] PROGRESS NOTE Interim History: 80 y.o. male with a history of Chronic systolic CHF, CAD S/P PCI with Stent x 3, PAF, HTN, DM2, CKD stage IV, and Iron deficiency Anemia who presents to the ED with complaints of worsening SOB x 1 week. He denies fevers or chills or cough and chest pain. He was evaluated in the ED and was found [place]NP [place] 160[address].0, and A LLL opacity and pleural effusion on chest X-ray.   Filed Weights   11/06/13 0619 11/06/13 0752 11/07/13 0538  Weight: 90.402 kg (199 lb 4.8 oz) 84.9 kg (187 lb 2.7 oz) 83.5 kg (184 lb 1.4 oz)        Intake/Output Summary (Last 24 hours) at 11/07/13 1116 Last data filed at 11/07/13 0831  Gross per 24 hour  Intake   1080 ml  Output   1125 ml  Net    -45 ml    Assessment/Plan: Acute on chronic diastolic heart failure: - Echo is as below. + JVD, no lower ext edema, place ted hose. - Good UOP, 87.9 -> 84.9-> 83.5  kg. - Change IV lasix to oral. Monitor electrolytes, daily weights. - PT eval pending. Probable home in am.  Anemia due to chronic blood loss: - Cont iron supplementation. - No hbg to compare it with except for one in 2013.  CKD (chronic kidney disease) stage 4, GFR 15-29 ml/min: - GFR 18.  PAF (paroxysmal atrial fibrillation): - no events.  DM (diabetes mellitus), type 2 - On SSI. A1c 8.4, will need to add insulin to his home regimen. - Diabetes education.   Code Status: DO NOT RESUSCITATE  Family Communication: Son at Bedside  Disposition Plan: home in 24 hrs   Consultants:  none  Procedures: ECHO: ejection fraction was in the range of 50% to 55%. Wall motion appears dyssynchronous and varies from beat to beat. Overall LVEF low normal. The study is not technically sufficient to allow evaluation of LV diastolic function  Antibiotics:  None  HPI/Subjective: No compalins  Objective: Filed Vitals:   11/06/13 2131 11/07/13 0159 11/07/13 0538 11/07/13 0831  BP:  130/58 121/58     Pulse:  63 60   Temp:  97.7 F (36.5 C) 97.8 F (36.6 C)   TempSrc:  Oral Oral   Resp:  20 20   Height:      Weight:   83.5 kg (184 lb 1.4 oz)   SpO2: 99% 95% 96% 94%     Exam: General: Alert, awake, oriented x3, in no acute distress.  HEENT: No bruits, no goiter. + JVD Heart: Regular rate and rhythm,  Lungs: Good air movement, clear Abdomen: Soft, nontender, nondistended, positive bowel sounds.    Data Reviewed: Basic Metabolic Panel:  Recent Labs Lab 11/04/13 2014 11/06/13 0312 11/07/13 0357  NA 141 141 144  K 3.5* 3.0* 4.0  CL 99 96 99  CO2 24 27 28   GLUCOSE 120* 94 109*  BUN 118* 112* 116*  CREATININE 3.27* 3.25* 3.34*  CALCIUM 8.6 8.8 8.9   Liver Function Tests: No results found for this basename: AST, ALT, ALKPHOS, BILITOT, PROT, ALBUMIN,  in the last 168 hours No results found for this basename: LIPASE, AMYLASE,  in the last 168 hours No results found for this basename: AMMONIA,  in the last 168 hours CBC:  Recent Labs Lab 11/04/13 2014  WBC 8.4  NEUTROABS 6.9  HGB 9.8*  HCT 32.1*  MCV 88.7  PLT 193  Cardiac Enzymes:  Recent Labs Lab 11/04/13 2044  TROPONINI <0.30   BNP (last 3 results)  Recent Labs  11/04/13 2014  PROBNP 16031.0*   CBG:  Recent Labs Lab 11/06/13 0625 11/06/13 1104 11/06/13 1617 11/06/13 2121 11/07/13 0600  GLUCAP 94 144* 153* 171* 130*    No results found for this or any previous visit (from the past 240 hour(s)).   Studies: No results found.  Scheduled Meds: . aspirin  81 mg Oral Daily  . calcium carbonate  1 tablet Oral Q breakfast  . enoxaparin (LOVENOX) injection  30 mg Subcutaneous Q24H  . ferrous sulfate  325 mg Oral BID WC  . furosemide  80 mg Oral BID  . insulin aspart  0-5 Units Subcutaneous QHS  . insulin aspart  0-9 Units Subcutaneous TID WC  . insulin glargine  2 Units Subcutaneous QHS  . memantine  5 mg Oral BID  . metoprolol succinate  25 mg Oral Daily  . mirtazapine  7.5 mg Oral  QHS  . mometasone-formoterol  2 puff Inhalation BID  . pantoprazole  40 mg Oral Daily  . potassium chloride  40 mEq Oral BID  . rivastigmine  9.5 mg Transdermal Daily  . sertraline  100 mg Oral Daily  . sodium chloride  3 mL Intravenous Q12H   Continuous Infusions:    Marinda ElkFELIZ ORTIZ, ABRAHAM  [facility] Pager [phone] If 8PM-8AM, please contact night-coverage at [email or web address], password [email or web address] 11/07/2013, 11:16 AM  LOS: 3 days

## 2013-11-08 DIAGNOSIS — K219 Gastro-esophageal reflux disease without esophagitis: Secondary | ICD-10-CM

## 2013-11-08 DIAGNOSIS — M549 Dorsalgia, unspecified: Secondary | ICD-10-CM

## 2013-11-08 DIAGNOSIS — G8929 Other chronic pain: Secondary | ICD-10-CM

## 2013-11-08 DIAGNOSIS — R5381 Other malaise: Secondary | ICD-10-CM

## 2013-11-08 LAB — BASIC METABOLIC PANEL
ANION GAP: 14 (ref 5–15)
BUN: 114 mg/dL — ABNORMAL HIGH (ref 6–23)
CHLORIDE: 97 meq/L (ref 96–112)
CO2: 30 mEq/L (ref 19–32)
Calcium: 9.1 mg/dL (ref 8.4–10.5)
Creatinine, Ser: 3.4 mg/dL — ABNORMAL HIGH (ref 0.50–1.35)
GFR calc non Af Amer: 15 mL/min — ABNORMAL LOW (ref >90)
GFR, EST AFRICAN AMERICAN: 17 mL/min — AB (ref >90)
Glucose, Bld: 106 mg/dL — ABNORMAL HIGH (ref 70–99)
Potassium: 4.2 mEq/L (ref 3.7–5.3)
Sodium: 141 mEq/L (ref 137–147)

## 2013-11-08 LAB — GLUCOSE, CAPILLARY
GLUCOSE-CAPILLARY: 168 mg/dL — AB (ref 70–99)
Glucose-Capillary: 157 mg/dL — ABNORMAL HIGH (ref 70–99)

## 2013-11-08 MED ORDER — OXYCODONE-ACETAMINOPHEN 5-325 MG PO TABS: 1.0000 | ORAL_TABLET | Freq: Four times a day (QID) | ORAL | Status: AC | PRN

## 2013-11-08 MED ORDER — POTASSIUM CHLORIDE CRYS ER 20 MEQ PO TBCR: 40.0000 meq | EXTENDED_RELEASE_TABLET | Freq: Every day | ORAL | Status: AC

## 2013-11-08 MED ORDER — HYDROCODONE-ACETAMINOPHEN 7.5-325 MG PO TABS: 1.0000 | ORAL_TABLET | Freq: Four times a day (QID) | ORAL | Status: AC | PRN

## 2013-11-08 MED ORDER — LEVOFLOXACIN 500 MG PO TABS: 500.0000 mg | ORAL_TABLET | ORAL | Status: AC

## 2013-11-08 NOTE — Progress Notes (Signed)
Report called to Morrie SheldonAshley, RN [facility].

## 2013-11-08 NOTE — Discharge Summary (Signed)
Physician Discharge Summary  Joseph Hobbs WJX:[id number] DOB: 14-Oct-1924 DOA: 11/04/2013  PCP: Pcp Not In System  Admit date: 11/04/2013 Discharge date: 11/08/2013  Time spent: >30 minutes  Recommendations for Outpatient Follow-up:  1. BMET in 1 week to follow electrolytes and renal function 2. CXR Follow up in 4 weeks (evaluate infiltrates, pleural effusion and vascular congestion) Reassess BP and adjust medications as needed 3. Close follow up to CBG's  BNP    Component Value Date/Time   PROBNP 16031.0* 11/04/2013 2014   Filed Weights   11/06/13 0752 11/07/13 0538 11/08/13 0506  Weight: 84.9 kg (187 lb 2.7 oz) 83.5 kg (184 lb 1.4 oz) 82.101 kg (181 lb)     Discharge Diagnoses:  Principal Problem:   Acute on chronic systolic heart failure Active Problems:   PAF (paroxysmal atrial fibrillation)   Hyperlipidemia   DM (diabetes mellitus), type 2   Coronary artery disease   CKD (chronic kidney disease) stage 4, GFR 15-29 ml/min   SOB (shortness of breath)   Anemia due to chronic blood loss COPD and bronchiectasis  Physical deconditioning Dementia Chronic back pain  Discharge Condition: stable and improve, discharge to SNF for rehabilitation. Follow up with PCP in 1 week.   Diet recommendation: low sodium and low carbs diet    History of present illness:  80 y.o. male with a history of Chronic systolic CHF, CAD S/P PCI with Stent x 3, PAF, HTN, DM2, CKD stage IV, and Iron deficiency Anemia who presents to the ED with complaints of worsening SOB x 1 week. He denies fevers or chills or cough and chest pain. He was evaluated in the ED and was found [place] 16031.0 and with vascular congestion on CXR. [facility] admitted patient for further evaluation and treatment.   Hospital Course:  Acute on chronic diastolic heart failure:  - Echo is as below. No JVD, no lower ext edema at discharge. According to patient breathing is back to baseline.  - Good UOP, 87.9 -> 84.9->  83.5 kg-> 82.1 kg.  - Continue lasix 80mg  BID, continue b-blocker -low sodium diet to oral. Monitor electrolytes, daily weights.   Anemia due to chronic blood loss:  - Cont iron supplementation.   CKD (chronic kidney disease) stage 4, GFR 15-29 ml/min:  - GFR 18.  -stable and at baseline -close follow up as an outpatient recommended   PAF (paroxysmal atrial fibrillation):  - no events.  -continue ASA  DM (diabetes mellitus), type 2  - A1c 8.4 -will continue oral regimen for now; close follow up and initiation of insulin if CBG's/A1C deteriorates.  -low carbs diet  Chronic Back pain and neuropathy: continue home pain medication regimen.  COPD and acute bronchiectasis: will continue inhaler regimen and add coverage with levaquin for bronchiectasis. -patient will benefit of flutter valve use and pulmonary rehab  Dementia: continue exelon patch and namenda  Hypokalemia: due to diuresis -will discharge on potassium supplementation   Physical deconditioning and impaired gait: will discharge to SNF for rehabilitation    Procedures: See below for x-ray reports  2-D Echo: ejection fraction was in the range of 50% to 55%. Wall motion appears dyssynchronous and varies from beat to beat. Overall LVEF low normal. The study is not technically sufficient to allow evaluation of LV diastolic function  Consultations:  None   Discharge Exam: Filed Vitals:   11/08/13 0935  BP: 136/48  Pulse: 70  Temp: 98 F (36.7 C)  Resp: 18  General: AAOX2, no fever; able to speak in full sentences; denies CP or significant SOB. Patient is weak, frail and deconditioned Cardiovascular: S1 and S2, no rubs or gallops; no JVD Respiratory: positive rhonchi and exp wheezing; no frank crackles on exam Abd: soft, NT, positive BS Extremities: no edema or cyanosis  Discharge Instructions  Discharge Instructions   Diet - low sodium heart healthy    Complete by:  As directed      Discharge  instructions    Complete by:  As directed   Follow a low sodium diet ( less than 2 grams daily) Take medications as prescribed Fluid restriction 2L in 24 hours Arrange follow up with PCP in 7 days Physical therapy as per SNF protocol  BMET in 1 week to follow electrolytes and renal function            Medication List         aspirin 81 MG tablet  Take 81 mg by mouth daily.     calcium carbonate 1250 MG tablet  Commonly known as:  OS-CAL - dosed in mg of elemental calcium  Take 1 tablet by mouth.     cetaphil cream  Apply topically daily.     Dexlansoprazole 30 MG capsule  Take 30 mg by mouth daily.     ferrous sulfate 325 (65 FE) MG tablet  Take 325 mg by mouth 2 (two) times daily with a meal.     Fluticasone-Salmeterol 100-50 MCG/DOSE Aepb  Commonly known as:  ADVAIR  Inhale 1 puff into the lungs 2 (two) times daily.     furosemide 40 MG tablet  Commonly known as:  LASIX  Take 80 mg by mouth 2 (two) times daily.     glimepiride 2 MG tablet  Commonly known as:  AMARYL  Take 2 mg by mouth daily with breakfast.     HYDROcodone-acetaminophen 7.5-325 MG per tablet  Commonly known as:  NORCO  Take 1 tablet by mouth every 6 (six) hours as needed for moderate pain.     isosorbide-hydrALAZINE 20-37.5 MG per tablet  Commonly known as:  BIDIL  Take 1 tablet by mouth 2 (two) times daily.     levofloxacin 500 MG tablet  Commonly known as:  LEVAQUIN  Take 1 tablet (500 mg total) by mouth every other day.     Melatonin 5 MG Tabs  Take 5 mg by mouth at bedtime.     metoprolol succinate 25 MG 24 hr tablet  Commonly known as:  TOPROL-XL  Take 25 mg by mouth daily.     mirtazapine 7.5 MG tablet  Commonly known as:  REMERON  Take 7.5 mg by mouth at bedtime.     NAMENDA 5 MG tablet  Generic drug:  memantine  Take 5 mg by mouth 2 (two) times daily.     nitroGLYCERIN 0.4 MG SL tablet  Commonly known as:  NITROSTAT  Place 0.4 mg under the tongue every 5 (five) minutes  as needed.     oxyCODONE-acetaminophen 5-325 MG per tablet  Commonly known as:  PERCOCET/ROXICET  Take 1 tablet by mouth every 6 (six) hours as needed for severe pain.     potassium chloride SA 20 MEQ tablet  Commonly known as:  K-DUR,KLOR-CON  Take 2 tablets (40 mEq total) by mouth daily.     rivastigmine 9.5 mg/24hr  Commonly known as:  EXELON  Place 9.5 mg onto the skin daily.     sertraline 100 MG tablet  Commonly known as:  ZOLOFT  Take 100 mg by mouth daily.     sitaGLIPtin 50 MG tablet  Commonly known as:  JANUVIA  Take 50 mg by mouth daily.       Allergies  Allergen Reactions  . Penicillins Other (See Comments)    UNKNOWN  . Soap Itching, Rash and Other (See Comments)    Can tolerate hypoallergenic soap     The results of significant diagnostics from this hospitalization (including imaging, microbiology, ancillary and laboratory) are listed below for reference.    Significant Diagnostic Studies: Dg Chest 2 View  11/04/2013   CLINICAL DATA:  Shortness of breath for the last week with weakness, history of hypertension and diabetes  EXAM: CHEST  2 VIEW  COMPARISON:  01/10/2010  FINDINGS: Mild cardiac enlargement. Extensive calcification of the aortic arch and descending aorta. Two lead cardiac pacer stable.  Old right rib fractures.  Right lung is clear.  On the left, numerous old rib fractures also identified. There is retrocardiac opacification with blunting of the left costophrenic angle, both new when compared to the prior study.  IMPRESSION: Retrocardiac left lower lobe opacity represents a combination of pleural effusion and consolidation. Pneumonia is not excluded.   Electronically Signed   By: Esperanza Heir M.D.   On: 11/04/2013 21:59   US Renal  10/17/2013   CLINICAL DATA:  Stage IV chronic kidney disease.  EXAM: RENAL/URINARY TRACT ULTRASOUND COMPLETE  COMPARISON:  None.  FINDINGS: Right Kidney:  Length: 10.9 cm in length. Echogenicity of the renal cortex is  diffusely increased and there is diffuse cortical thinning. Negative for hydronephrosis or mass.  Left Kidney:  Length: 12.1 cm. Echogenicity of the renal cortex is diffusely increased and there is diffuse cortical thinning. Negative for hydronephrosis or mass.  Bladder:  Appears normal for degree of bladder distention.  IMPRESSION: Increased echogenicity and thinning of the cortices bilaterally is consistent with chronic medical renal disease.  Negative for hydronephrosis.   Electronically Signed   By: Britta Mccreedy M.D.   On: 10/17/2013 15:34   US Aorta  10/13/2013   CLINICAL DATA:  Back pain  EXAM: ULTRASOUND OF ABDOMINAL AORTA  TECHNIQUE: Ultrasound examination of the abdominal aorta was performed to evaluate for abdominal aortic aneurysm.  COMPARISON:  None.  FINDINGS: Abdominal Aorta  No aneurysm identified.  Maximum AP  Diameter:  2.0 cm  Maximum TRV  Diameter: 2.7 cm  IMPRESSION: No evidence of an abdominal aortic aneurysm.   Electronically Signed   By: Elige Ko   On: 10/13/2013 15:08   Labs: Basic Metabolic Panel:  Recent Labs Lab 11/04/13 2014 11/06/13 0312 11/07/13 0357 11/08/13 0503  NA 141 141 144 141  K 3.5* 3.0* 4.0 4.2  CL 99 96 99 97  CO2 24 27 28 30   GLUCOSE 120* 94 109* 106*  BUN 118* 112* 116* 114*  CREATININE 3.27* 3.25* 3.34* 3.40*  CALCIUM 8.6 8.8 8.9 9.1   CBC:  Recent Labs Lab 11/04/13 2014  WBC 8.4  NEUTROABS 6.9  HGB 9.8*  HCT 32.1*  MCV 88.7  PLT 193   Cardiac Enzymes:  Recent Labs Lab 11/04/13 2044  TROPONINI <0.30   BNP: BNP (last 3 results)  Recent Labs  11/04/13 2014  PROBNP 16031.0*   CBG:  Recent Labs Lab 11/07/13 1304 11/07/13 1633 11/07/13 2053 11/08/13 0619 11/08/13 1126  GLUCAP 186* 146* 170* 157* 168*    Signed:  Vassie Loll  [facility] 11/08/2013, 12:17 PM

## 2013-11-08 NOTE — Progress Notes (Signed)
[facility]. Patient being taken to facility by son, Lupe CarneyDean Behan. Patient taken out of hospital via wheelchair.

## 2013-11-09 NOTE — Progress Notes (Addendum)
Ok per MD [facility]. Patient's son August SaucerDean was with him and transported by car.  Patient was sitting up in chair and dressed- very pleased about d/c.  Nursing notified to call report. Spoke to [facility] [employer]- patient's bed is available.  No further CSW needs identified. CSW signing off.  Lorri Frederickonna T. Jaci LazierCrowder, [place]CSW  [phone]

## 2013-11-10 ENCOUNTER — Non-Acute Institutional Stay (SKILLED_NURSING_FACILITY): Payer: Medicare Other | Admitting: Internal Medicine

## 2013-11-10 DIAGNOSIS — E1140 Type 2 diabetes mellitus with diabetic neuropathy, unspecified: Secondary | ICD-10-CM

## 2013-11-10 DIAGNOSIS — L03119 Cellulitis of unspecified part of limb: Secondary | ICD-10-CM

## 2013-11-10 DIAGNOSIS — N184 Chronic kidney disease, stage 4 (severe): Secondary | ICD-10-CM

## 2013-11-10 DIAGNOSIS — I5023 Acute on chronic systolic (congestive) heart failure: Secondary | ICD-10-CM

## 2013-11-10 DIAGNOSIS — J449 Chronic obstructive pulmonary disease, unspecified: Secondary | ICD-10-CM

## 2013-11-11 ENCOUNTER — Encounter: Payer: Self-pay | Admitting: Internal Medicine

## 2013-11-11 ENCOUNTER — Other Ambulatory Visit: Payer: Self-pay | Admitting: Internal Medicine

## 2013-11-11 DIAGNOSIS — F329 Major depressive disorder, single episode, unspecified: Secondary | ICD-10-CM | POA: Insufficient documentation

## 2013-11-11 DIAGNOSIS — F32A Depression, unspecified: Secondary | ICD-10-CM | POA: Insufficient documentation

## 2013-11-11 DIAGNOSIS — E1140 Type 2 diabetes mellitus with diabetic neuropathy, unspecified: Secondary | ICD-10-CM | POA: Insufficient documentation

## 2013-11-11 DIAGNOSIS — R490 Dysphonia: Secondary | ICD-10-CM | POA: Insufficient documentation

## 2013-11-11 DIAGNOSIS — J449 Chronic obstructive pulmonary disease, unspecified: Secondary | ICD-10-CM | POA: Insufficient documentation

## 2013-11-11 DIAGNOSIS — J479 Bronchiectasis, uncomplicated: Secondary | ICD-10-CM | POA: Insufficient documentation

## 2013-11-11 NOTE — Progress Notes (Signed)
Patient ID: Joseph Hobbs, male   DOB: 11-15-1924, 80 y.o.   MRN: [id number]    HISTORY AND PHYSICAL  Location:  [facility] Room Number: 149 Place of Service: SNF [phone])   Extended Emergency Contact Information Primary Emergency Contact: \Friedt,Dean Dr  [place] of [place] Phone: [phone] Work Phone: [phone] Mobile Phone: [phone] Relation: Son Secondary Emergency Contact: Urbieta,Dorothy Address: [address]          [place], [place] [address] [place] Phone: [phone] Relation: Spouse   Chief Complaint  Patient presents with  . New Admit To SNF    HPI:  80 year old male, whose primary care physician is Dr. Felipa Eth, and who previously lived in the assisted living area of [facility], was hospitalized 11/04/13 through 11/08/13 for acute on chronic systolic heart failure. He had worsening shortness of breath for 7 days prior to presentation to the emergency room. Patient was diuresed and improved during the hospitalization.  Hospital, he was sent to skilled nursing. He is too fragile to return to assisted living at this time.  He has a very complicated medical history. Diagnoses are noted below. This recently there have been issues of anemia. This is being worked up by Dr. Lu Duffel. There is a note that he was to have EGD and colonoscopy 11/01/13. I do not have any details about that.  Since coming to the skilled nursing area, he has shown several Were glucose determinations that are low. He is currently taking both glimepiride and Januvia.  There is a cellulitis of the left antecubital fossa as result of IV placement. It looks like the line infiltrated into this area, but there may be an infection as well.  Past Medical History  Diagnosis Date  . Hypertension   . History of acute myocardial infarction 2000--  S/P STENTS X3  . S/P coronary artery stent placement   . History of CHF  (congestive heart failure) POST SURGERY 2011    EF 35-40%  . PAF (paroxysmal atrial fibrillation) POST OP SURGERY 2011  . Hyperlipidemia   . Gait disorder USES WALKER  . Diabetic peripheral neuropathy   . GERD (gastroesophageal reflux disease)   . History of esophageal stricture POST DILATION'S  . Urethral stricture   . Urinary incontinence   . Chronic kidney disease (CKD), stage III (moderate)   . History of prostate cancer 1995-- S/P PROSTATECTOMY    NO RECURRENCE  . Arthritis   . Impaired hearing   . Dry skin AT TIMES GETS LESIONS--  CURRENTLY THEY ARE HEALED  . Chronic back pain   . Asymptomatic carotid artery stenosis BILATERAL ICA   -- PER DR SMITH NOTE   . Pacemaker PLACEMENT IN 2000 --  LAST PACER CHECK  07-08-2011  . Coronary artery disease CARDIOLOGIST- DR Daneen Schick-- LOV  06-08-2011--  CURRENT EKG, LAST PACER CHECK , ECHO W/ CHART (NO STRESS TEST )  . Alzheimer's disease DX ALZHEIMER'S IN 2010  . CHF (congestive heart failure)   . Acute on chronic systolic HF (heart failure) 11/04/13  . Type 2 diabetes mellitus with diabetic neuropathy   . CKD (chronic kidney disease) stage 4, GFR 15-29 ml/min   . Dyspnea   . Anemia in chronic kidney disease 2015  . COPD (chronic obstructive pulmonary disease)   . Bronchiectasis   . Cardiomegaly   . Aortic atherosclerosis 2015  . Multiple fractures of ribs of both sides 2008  . Depression   .  Pacemaker   . Cellulitis of forearm October 2015  . Hoarse 2015    Past Surgical History  Procedure Laterality Date  . Prostatectomy  1995    PROSTATE CANCER  . Lumbar disc surgery  X3   LAST ONE 1990  . Coronary angioplasty with stent placement  2000    X3 STENTS  ([place], [place])  . Neuroplasty / transposition median nerve at carpal tunnel bilateral    . Pilonidal cyst excision    . Tonsillectomy and adenoidectomy    . Partial colectomy/ repair fissure  2011    DIVERTICULITIS  . Transthoracic echocardiogram  01-03-2010  DR Malin EF 35-40%/ SEVERE LEFT ATRIAL ENLARGEMENT/ MODERATE MITRAL VALVE REGURG./ GRADE IID DIASTOLIC DYSFUNCTION/ MILD AORTIC  &  TRICUSPID VALVE REGURG  . Cystoscopy with urethral dilatation  11/19/2011    Procedure: CYSTOSCOPY WITH URETHRAL DILATATION;  Surgeon: Reece Packer, MD;  Location: [facility];  Service: Urology;  Laterality: N/A;  [place] DILATION    Patient Care Team: Lajean Manes, MD as PCP - General (Internal Medicine) Wonda Horner, MD as Consulting Physician (Gastroenterology) Sinclair Grooms, MD as Consulting Physician (Cardiology) Blane Ohara McDiarmid, MD as Consulting Physician (Family Medicine) Deboraha Sprang, MD as Consulting Physician (Cardiology) Janann August, MD as Referring Physician (Dermatology) Simona Huh, MD as Consulting Physician (Dermatology) Linton Rump, MD as Consulting Physician (Ophthalmology) Rexene Agent, MD as Attending Physician (Nephrology)   History   Social History  . Marital Status: Married    Spouse Name: N/A    Number of Children: N/A  . Years of Education: N/A   Occupational History  . Not on file.   Social History Main Topics  . Smoking status: Never Smoker   . Smokeless tobacco: Never Used  . Alcohol Use: No  . Drug Use: No  . Sexual Activity: Not Currently   Other Topics Concern  . Not on file   Social History Narrative  . No narrative on file     reports that he has never smoked. He has never used smokeless tobacco. He reports that he does not drink alcohol or use illicit drugs.   There is no immunization history on file for this patient.  Allergies  Allergen Reactions  . Penicillins Other (See Comments)    UNKNOWN  . Soap Itching, Rash and Other (See Comments)    Can tolerate hypoallergenic soap    Medications: Patient's Medications  New Prescriptions   No medications on file  Previous Medications   ASPIRIN 81 MG TABLET     Take 81 mg by mouth daily.   CALCIUM CARBONATE (OS-CAL - DOSED IN MG OF ELEMENTAL CALCIUM) 1250 MG TABLET    Take 1 tablet by mouth.   CETAPHIL (CETAPHIL) CREAM    Apply topically daily.   DEXLANSOPRAZOLE 30 MG CAPSULE    Take 30 mg by mouth daily.   FERROUS SULFATE 325 (65 FE) MG TABLET    Take 325 mg by mouth 2 (two) times daily with a meal.   FLUTICASONE-SALMETEROL (ADVAIR) 100-50 MCG/DOSE AEPB    Inhale 1 puff into the lungs 2 (two) times daily.   FUROSEMIDE (LASIX) 40 MG TABLET    Take 80 mg by mouth 2 (two) times daily.    GLIMEPIRIDE (AMARYL) 2 MG TABLET    Take 2 mg by mouth daily with breakfast.   HYDROCODONE-ACETAMINOPHEN (NORCO) 7.5-325 MG PER TABLET  Take 1 tablet by mouth every 6 (six) hours as needed for moderate pain.   ISOSORBIDE-HYDRALAZINE (BIDIL) 20-37.5 MG PER TABLET    Take 1 tablet by mouth 2 (two) times daily.    LEVOFLOXACIN (LEVAQUIN) 500 MG TABLET    Take 1 tablet (500 mg total) by mouth every other day.   MELATONIN 5 MG TABS    Take 5 mg by mouth at bedtime.   METOPROLOL SUCCINATE (TOPROL-XL) 25 MG 24 HR TABLET    Take 25 mg by mouth daily.   MIRTAZAPINE (REMERON) 7.5 MG TABLET    Take 7.5 mg by mouth at bedtime.   NAMENDA 5 MG TABLET    Take 5 mg by mouth 2 (two) times daily.    NITROGLYCERIN (NITROSTAT) 0.4 MG SL TABLET    Place 0.4 mg under the tongue every 5 (five) minutes as needed.   OXYCODONE-ACETAMINOPHEN (PERCOCET/ROXICET) 5-325 MG PER TABLET    Take 1 tablet by mouth every 6 (six) hours as needed for severe pain.   POTASSIUM CHLORIDE SA (K-DUR,KLOR-CON) 20 MEQ TABLET    Take 2 tablets (40 mEq total) by mouth daily.   RIVASTIGMINE (EXELON) 9.5 MG/24HR    Place 9.5 mg onto the skin daily.   SERTRALINE (ZOLOFT) 100 MG TABLET    Take 100 mg by mouth daily.   SITAGLIPTIN (JANUVIA) 50 MG TABLET    Take 50 mg by mouth daily.  Modified Medications   No medications on file  Discontinued Medications   No medications on file     Review of Systems    Constitutional: Positive for activity change and fatigue. Negative for fever, appetite change and unexpected weight change.  HENT: Positive for hearing loss. Negative for congestion, ear pain, rhinorrhea, sore throat, tinnitus, trouble swallowing and voice change.   Eyes: Positive for visual disturbance (Corrective lenses).       Corrective lenses  Respiratory: Positive for shortness of breath. Negative for cough, choking, chest tightness and wheezing.   Cardiovascular: Negative for chest pain, palpitations and leg swelling (1+ bipedal edema).       History of pacemaker. History of atherosclerosis of the aorta. History of cardiomegaly.  Gastrointestinal: Negative for nausea, abdominal pain, diarrhea, constipation and abdominal distention.       History of GERD and esophageal stenosis requiring dilation.  Endocrine: Negative for cold intolerance, heat intolerance, polydipsia, polyphagia and polyuria.       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  Genitourinary: Negative for dysuria, urgency, frequency and testicular pain.       History of prostate cancer. Incontinent. Chronic kidney disease, stage IV  Musculoskeletal: Positive for arthralgias, back pain and gait problem. Negative for myalgias and neck pain.       History of multiple rib fractures bilaterally  Skin: Negative for color change, pallor and rash.       Cellulitis with erythema and puffiness at the left antecubital fossa. There is tenderness to palpation.  Allergic/Immunologic: Negative.   Neurological: Negative for dizziness, tremors, syncope, speech difficulty, weakness, numbness and headaches.  Hematological: Negative for adenopathy. Does not bruise/bleed easily.       History of anemia  Psychiatric/Behavioral: Negative for hallucinations, behavioral problems, confusion, sleep disturbance and decreased concentration. The patient is not nervous/anxious.        Depressed about loss of memory     Filed Vitals:   11/11/13 1501  BP: 118/66  Pulse: 69  Temp: 97.1 F (36.2 C)  Resp: 20  Height:  5' 9"  (1.753 m)  Weight: 175 lb 9.6 oz (79.652 kg)  SpO2: 98%   Body mass index is 25.92 kg/(m^2).  Physical Exam  Constitutional: He is oriented to person, place, and time. He appears well-developed and well-nourished. No distress.  Elderly. Frail.  HENT:  Right Ear: External ear normal.  Left Ear: External ear normal.  Nose: Nose normal.  Mouth/Throat: Oropharynx is clear and moist. No oropharyngeal exudate.  Eyes: Conjunctivae and EOM are normal. Pupils are equal, round, and reactive to light.  Neck: No JVD present. No tracheal deviation present. No thyromegaly present.  Cardiovascular: Normal rate, regular rhythm and normal heart sounds.  Exam reveals no gallop and no friction rub.   No murmur heard. Pulmonary/Chest: No respiratory distress. He has wheezes. He has rales. He exhibits no tenderness.  Bronchial rhonchi worse on the right. There is some hoarseness when he speaks.  Abdominal: He exhibits no distension and no mass. There is no tenderness.  Musculoskeletal: Normal range of motion. He exhibits no edema and no tenderness.  Lymphadenopathy:    He has no cervical adenopathy.  Neurological: He is alert and oriented to person, place, and time. He has normal reflexes. No cranial nerve deficit. Coordination normal.  Confused. Decreased memory.  Skin: No rash noted. There is erythema.  Cellulitis of the left antecubital fossa with erythema, tenderness, and a puffy appearance.  Psychiatric: He has a normal mood and affect. His behavior is normal. Judgment and thought content normal.  Appropriately responsive at this time. History of depression, which seems to be under control.     Labs reviewed: Admission on 11/04/2013, Discharged on 11/08/2013  Component Date Value Ref Range Status  . Pro B Natriuretic peptide (BNP) 11/04/2013 16031.0* 0 - 450 pg/mL Final  . Troponin  i, poc 11/04/2013 0.15* 0.00 - 0.08 ng/mL Final  . Comment 11/04/2013 NOTIFIED PHYSICIAN   Final  . Comment 3 11/04/2013          Final   Comment: Due to the release kinetics of cTnI,                          a negative result within the first hours                          of the onset of symptoms does not rule out                          myocardial infarction with certainty.                          If myocardial infarction is still suspected,                          repeat the test at appropriate intervals.  . WBC 11/04/2013 8.4  4.0 - 10.5 K/uL Final  . RBC 11/04/2013 3.62* 4.22 - 5.81 MIL/uL Final  . Hemoglobin 11/04/2013 9.8* 13.0 - 17.0 g/dL Final  . HCT 11/04/2013 32.1* 39.0 - 52.0 % Final  . MCV 11/04/2013 88.7  78.0 - 100.0 fL Final  . MCH 11/04/2013 27.1  26.0 - 34.0 pg Final  . MCHC 11/04/2013 30.5  30.0 - 36.0 g/dL Final  . RDW 11/04/2013 16.0* 11.5 - 15.5 % Final  . Platelets 11/04/2013 193  150 - 400  K/uL Final  . Neutrophils Relative % 11/04/2013 82* 43 - 77 % Final  . Neutro Abs 11/04/2013 6.9  1.7 - 7.7 K/uL Final  . Lymphocytes Relative 11/04/2013 6* 12 - 46 % Final  . Lymphs Abs 11/04/2013 0.5* 0.7 - 4.0 K/uL Final  . Monocytes Relative 11/04/2013 11  3 - 12 % Final  . Monocytes Absolute 11/04/2013 0.9  0.1 - 1.0 K/uL Final  . Eosinophils Relative 11/04/2013 1  0 - 5 % Final  . Eosinophils Absolute 11/04/2013 0.1  0.0 - 0.7 K/uL Final  . Basophils Relative 11/04/2013 0  0 - 1 % Final  . Basophils Absolute 11/04/2013 0.0  0.0 - 0.1 K/uL Final  . Sodium 11/04/2013 141  137 - 147 mEq/L Final  . Potassium 11/04/2013 3.5* 3.7 - 5.3 mEq/L Final  . Chloride 11/04/2013 99  96 - 112 mEq/L Final  . CO2 11/04/2013 24  19 - 32 mEq/L Final  . Glucose, Bld 11/04/2013 120* 70 - 99 mg/dL Final  . BUN 11/04/2013 118* 6 - 23 mg/dL Final  . Creatinine, Ser 11/04/2013 3.27* 0.50 - 1.35 mg/dL Final  . Calcium 11/04/2013 8.6  8.4 - 10.5 mg/dL Final  . GFR calc non Af Amer  11/04/2013 16* >90 mL/min Final  . GFR calc Af Amer 11/04/2013 18* >90 mL/min Final   Comment: (NOTE)                          The eGFR has been calculated using the CKD EPI equation.                          This calculation has not been validated in all clinical situations.                          eGFR's persistently <90 mL/min signify possible Chronic Kidney                          Disease.  . Anion gap 11/04/2013 18* 5 - 15 Final  . Troponin I 11/04/2013 <0.30  <0.30 ng/mL Final   Comment:                                 Due to the release kinetics of cTnI,                          a negative result within the first hours                          of the onset of symptoms does not rule out                          myocardial infarction with certainty.                          If myocardial infarction is still suspected,                          repeat the test at appropriate intervals.  . Hemoglobin A1C 11/05/2013 8.4* <5.7 % Final   Comment: (NOTE)  According to the ADA Clinical Practice Recommendations for 2011, when                          HbA1c is used as a screening test:                           >=6.5%   Diagnostic of Diabetes Mellitus                                    (if abnormal result is confirmed)                          5.7-6.4%   Increased risk of developing Diabetes Mellitus                          References:Diagnosis and Classification of Diabetes Mellitus,Diabetes                          EXHB,7169,67(ELFYB 1):S62-S69 and Standards of Medical Care in                                  Diabetes - 2011,Diabetes OFBP,1025,85 (Suppl 1):S11-S61.  . Mean Plasma Glucose 11/05/2013 194* <117 mg/dL Final   Performed at [facility]  . Glucose-Capillary 11/05/2013 130* 70 - 99 mg/dL Final  . Glucose-Capillary 11/05/2013 95  70 - 99 mg/dL Final  .  Glucose-Capillary 11/05/2013 109* 70 - 99 mg/dL Final  . Comment 1 11/05/2013 Documented in Chart   Final  . Comment 2 11/05/2013 Notify RN   Final  . Glucose-Capillary 11/05/2013 138* 70 - 99 mg/dL Final  . Comment 1 11/05/2013 Documented in Chart   Final  . Comment 2 11/05/2013 Notify RN   Final  . Sodium 11/06/2013 141  137 - 147 mEq/L Final  . Potassium 11/06/2013 3.0* 3.7 - 5.3 mEq/L Final  . Chloride 11/06/2013 96  96 - 112 mEq/L Final  . CO2 11/06/2013 27  19 - 32 mEq/L Final  . Glucose, Bld 11/06/2013 94  70 - 99 mg/dL Final  . BUN 11/06/2013 112* 6 - 23 mg/dL Final  . Creatinine, Ser 11/06/2013 3.25* 0.50 - 1.35 mg/dL Final  . Calcium 11/06/2013 8.8  8.4 - 10.5 mg/dL Final  . GFR calc non Af Amer 11/06/2013 16* >90 mL/min Final  . GFR calc Af Amer 11/06/2013 18* >90 mL/min Final   Comment: (NOTE)                          The eGFR has been calculated using the CKD EPI equation.                          This calculation has not been validated in all clinical situations.                          eGFR's persistently <90 mL/min signify possible Chronic Kidney                          Disease.  . Anion gap  11/06/2013 18* 5 - 15 Final  . Glucose-Capillary 11/05/2013 109* 70 - 99 mg/dL Final  . Glucose-Capillary 11/06/2013 94  70 - 99 mg/dL Final  . Glucose-Capillary 11/06/2013 144* 70 - 99 mg/dL Final  . Comment 1 11/06/2013 Notify RN   Final  . Sodium 11/07/2013 144  137 - 147 mEq/L Final  . Potassium 11/07/2013 4.0  3.7 - 5.3 mEq/L Final   DELTA CHECK NOTED  . Chloride 11/07/2013 99  96 - 112 mEq/L Final  . CO2 11/07/2013 28  19 - 32 mEq/L Final  . Glucose, Bld 11/07/2013 109* 70 - 99 mg/dL Final  . BUN 11/07/2013 116* 6 - 23 mg/dL Final  . Creatinine, Ser 11/07/2013 3.34* 0.50 - 1.35 mg/dL Final  . Calcium 11/07/2013 8.9  8.4 - 10.5 mg/dL Final  . GFR calc non Af Amer 11/07/2013 15* >90 mL/min Final  . GFR calc Af Amer 11/07/2013 18* >90 mL/min Final   Comment: (NOTE)                           The eGFR has been calculated using the CKD EPI equation.                          This calculation has not been validated in all clinical situations.                          eGFR's persistently <90 mL/min signify possible Chronic Kidney                          Disease.  . Anion gap 11/07/2013 17* 5 - 15 Final  . Glucose-Capillary 11/06/2013 153* 70 - 99 mg/dL Final  . Comment 1 11/06/2013 Notify RN   Final  . Glucose-Capillary 11/06/2013 171* 70 - 99 mg/dL Final  . Comment 1 11/06/2013 Documented in Chart   Final  . Comment 2 11/06/2013 Notify RN   Final  . Glucose-Capillary 11/07/2013 130* 70 - 99 mg/dL Final  . Comment 1 11/07/2013 Documented in Chart   Final  . Comment 2 11/07/2013 Notify RN   Final  . Glucose-Capillary 11/07/2013 186* 70 - 99 mg/dL Final  . Comment 1 11/07/2013 Documented in Chart   Final  . Glucose-Capillary 11/07/2013 146* 70 - 99 mg/dL Final  . Comment 1 11/07/2013 Notify RN   Final  . Sodium 11/08/2013 141  137 - 147 mEq/L Final  . Potassium 11/08/2013 4.2  3.7 - 5.3 mEq/L Final  . Chloride 11/08/2013 97  96 - 112 mEq/L Final  . CO2 11/08/2013 30  19 - 32 mEq/L Final  . Glucose, Bld 11/08/2013 106* 70 - 99 mg/dL Final  . BUN 11/08/2013 114* 6 - 23 mg/dL Final  . Creatinine, Ser 11/08/2013 3.40* 0.50 - 1.35 mg/dL Final  . Calcium 11/08/2013 9.1  8.4 - 10.5 mg/dL Final  . GFR calc non Af Amer 11/08/2013 15* >90 mL/min Final  . GFR calc Af Amer 11/08/2013 17* >90 mL/min Final   Comment: (NOTE)                          The eGFR has been calculated using the CKD EPI equation.  This calculation has not been validated in all clinical situations.                          eGFR's persistently <90 mL/min signify possible Chronic Kidney                          Disease.  . Anion gap 11/08/2013 14  5 - 15 Final  . Glucose-Capillary 11/07/2013 170* 70 - 99 mg/dL Final  . Glucose-Capillary 11/08/2013 157* 70 - 99 mg/dL Final  .  Glucose-Capillary 11/08/2013 168* 70 - 99 mg/dL Final  . Comment 1 11/08/2013 Notify RN   Final  Admission on 10/13/2013, Discharged on 10/13/2013  Component Date Value Ref Range Status  . Color, Urine 10/13/2013 YELLOW  YELLOW Final  . APPearance 10/13/2013 CLEAR  CLEAR Final  . Specific Gravity, Urine 10/13/2013 1.016  1.005 - 1.030 Final  . pH 10/13/2013 5.0  5.0 - 8.0 Final  . Glucose, UA 10/13/2013 NEGATIVE  NEGATIVE mg/dL Final  . Hgb urine dipstick 10/13/2013 TRACE* NEGATIVE Final  . Bilirubin Urine 10/13/2013 NEGATIVE  NEGATIVE Final  . Ketones, ur 10/13/2013 NEGATIVE  NEGATIVE mg/dL Final  . Protein, ur 10/13/2013 NEGATIVE  NEGATIVE mg/dL Final  . Urobilinogen, UA 10/13/2013 0.2  0.0 - 1.0 mg/dL Final  . Nitrite 10/13/2013 NEGATIVE  NEGATIVE Final  . Leukocytes, UA 10/13/2013 NEGATIVE  NEGATIVE Final  . Squamous Epithelial / LPF 10/13/2013 RARE  RARE Final  . WBC, UA 10/13/2013 0-2  <3 WBC/hpf Final  . RBC / HPF 10/13/2013 11-20  <3 RBC/hpf Final  Office Visit on 09/19/2013  Component Date Value Ref Range Status  . Date Time Interrogation Session 09/19/2013 [id number]   Final  . Pulse Generator Manufacturer 09/19/2013 [employer]   Final  . Pulse Gen Model 09/19/2013 [address]   Final  . Pulse Gen Serial Number 09/19/2013 [id number]   Final  . RV Sense Sensitivity 09/19/2013 2   Final  . RA Pace Amplitude 09/19/2013 2.5   Final  . RV Pace PulseWidth 09/19/2013 0.4   Final  . RV Pace Amplitude 09/19/2013 1   Final  . RA Impedance 09/19/2013 437.5   Final  . RA Amplitude 09/19/2013 0.6   Final  . RA Pacing Amplitude 09/19/2013 1   Final  . RA Pacing PulseWidth 09/19/2013 0.4   Final  . RA Pacing Amplitude 09/19/2013 1   Final  . RA Pacing PulseWidth 09/19/2013 0.4   Final  . RV IMPEDANCE 09/19/2013 412.5   Final  . RV Amplitude 09/19/2013 7.8   Final  . RV Pacing Amplitude 09/19/2013 0.75   Final  . RV Pacing PulseWidth 09/19/2013 0.4   Final  . Battery Status  09/19/2013 Unknown   Final  . Battery Longevity 09/19/2013 84   Final  . Battery Voltage 09/19/2013 2.92   Final  . Loletha Grayer RA Perc Paced 09/19/2013 93   Final  . Loletha Grayer RV Perc Paced 09/19/2013 99.63   Final  . Elizabeth Sauer Rhythm 09/19/2013 Brady @ 31   Final  . Miscellaneous Comment 09/19/2013    Final                   Value:Pacemaker check in clinic. Normal device function. Thresholds, sensing, impedances consistent with previous measurements. Device programmed to maximize longevity. 1 mode switch, 1 minute, - coumadin.  No high ventricular rates noted. Device programmed at  appropriate safety margins. Histogram distribution appropriate for patient activity level. AV delays reprogrammed 200/152mec. Estimated longevity 7 years. Patient enrolled in remote follow-up/TTM's with [facility]. Plan to follow every 3 months remotely                          and see annually in office. Patient education completed.  [place] 12/21/13.    Dg Chest 2 View  11/04/2013   CLINICAL DATA:  Shortness of breath for the last week with weakness, history of hypertension and diabetes  EXAM: CHEST  2 VIEW  COMPARISON:  01/10/2010  FINDINGS: Mild cardiac enlargement. Extensive calcification of the aortic arch and descending aorta. Two lead cardiac pacer stable.  Old right rib fractures.  Right lung is clear.  On the left, numerous old rib fractures also identified. There is retrocardiac opacification with blunting of the left costophrenic angle, both new when compared to the prior study.  IMPRESSION: Retrocardiac left lower lobe opacity represents a combination of pleural effusion and consolidation. Pneumonia is not excluded.   Electronically Signed   By: RSkipper ClicheM.D.   On: 11/04/2013 21:59   [place]  10/17/2013   CLINICAL DATA:  Stage IV chronic kidney disease.  EXAM: RENAL/URINARY TRACT ULTRASOUND COMPLETE  COMPARISON:  None.  FINDINGS: Right Kidney:  Length: 10.9 cm in length. Echogenicity of the  renal cortex is diffusely increased and there is diffuse cortical thinning. Negative for hydronephrosis or mass.  Left Kidney:  Length: 12.1 cm. Echogenicity of the renal cortex is diffusely increased and there is diffuse cortical thinning. Negative for hydronephrosis or mass.  Bladder:  Appears normal for degree of bladder distention.  IMPRESSION: Increased echogenicity and thinning of the cortices bilaterally is consistent with chronic medical renal disease.  Negative for hydronephrosis.   Electronically Signed   By: SCurlene DolphinM.D.   On: 10/17/2013 15:34   [place]  10/13/2013   CLINICAL DATA:  Back pain  EXAM: ULTRASOUND OF ABDOMINAL AORTA  TECHNIQUE: Ultrasound examination of the abdominal aorta was performed to evaluate for abdominal aortic aneurysm.  COMPARISON:  None.  FINDINGS: Abdominal Aorta  No aneurysm identified.  Maximum AP  Diameter:  2.0 cm  Maximum TRV  Diameter: 2.7 cm  IMPRESSION: No evidence of an abdominal aortic aneurysm.   Electronically Signed   By: HKathreen Devoid  On: 10/13/2013 15:08      Assessment/Plan  1. Acute on chronic systolic HF (heart failure) Appears reasonably compensated at this time BMP next week. Chest x-ray in 4 weeks.  2. Type 2 diabetes mellitus with diabetic neuropathy Discontinued glimepiride due to several episodes of hypoglycemia. Continue Januvia.  3. Chronic obstructive pulmonary disease, unspecified COPD, unspecified chronic bronchitis type Dyspnea with exertion  4. Cellulitis of forearm Currently on Levaquin every other day. If this is a staph infection, this is not likely to be a helpful antibiotic. Unchanged him to clindamycin 300 mg 3 times a day x7 days.  5. CKD (chronic kidney disease) stage 4, GFR 15-29 ml/min -BMP next week

## 2013-11-12 LAB — BASIC METABOLIC PANEL
BUN: 125 mg/dL — AB (ref 4–21)
Creatinine: 4.1 mg/dL — AB (ref 0.6–1.3)
Glucose: 508 mg/dL

## 2013-11-12 LAB — CBC AND DIFFERENTIAL: Hemoglobin: 12.3 g/dL — AB (ref 13.5–17.5)

## 2013-11-15 LAB — BASIC METABOLIC PANEL
BUN: 127 mg/dL — AB (ref 4–21)
Creatinine: 3.6 mg/dL — AB (ref 0.6–1.3)
GLUCOSE: 186 mg/dL

## 2013-11-15 LAB — CBC AND DIFFERENTIAL: HEMOGLOBIN: 10.4 g/dL — AB (ref 13.5–17.5)

## 2013-11-16 ENCOUNTER — Telehealth: Payer: Self-pay | Admitting: *Deleted

## 2013-11-16 NOTE — Telephone Encounter (Signed)
Kim with [facility] called and stated that the patient was not feeling well and wants you to come by and see him tomorrow at [facility].

## 2013-11-17 ENCOUNTER — Non-Acute Institutional Stay (SKILLED_NURSING_FACILITY): Payer: Medicare Other | Admitting: Internal Medicine

## 2013-11-17 DIAGNOSIS — D500 Iron deficiency anemia secondary to blood loss (chronic): Secondary | ICD-10-CM

## 2013-11-17 DIAGNOSIS — F329 Major depressive disorder, single episode, unspecified: Secondary | ICD-10-CM

## 2013-11-17 DIAGNOSIS — L03119 Cellulitis of unspecified part of limb: Secondary | ICD-10-CM

## 2013-11-17 DIAGNOSIS — I5023 Acute on chronic systolic (congestive) heart failure: Secondary | ICD-10-CM

## 2013-11-17 DIAGNOSIS — N184 Chronic kidney disease, stage 4 (severe): Secondary | ICD-10-CM

## 2013-11-17 DIAGNOSIS — J449 Chronic obstructive pulmonary disease, unspecified: Secondary | ICD-10-CM

## 2013-11-17 DIAGNOSIS — E1165 Type 2 diabetes mellitus with hyperglycemia: Secondary | ICD-10-CM

## 2013-11-17 DIAGNOSIS — F32A Depression, unspecified: Secondary | ICD-10-CM

## 2013-11-19 MED ORDER — PREDNISONE 20 MG PO TABS: ORAL_TABLET | ORAL | Status: AC

## 2013-11-19 NOTE — Progress Notes (Signed)
Patient ID: Joseph Hobbs, male   DOB: [birth year], 80 y.o.   MRN: [id number]    [place] Room Number: 149  Place of Service: SNF (31)    Allergies  Allergen Reactions  . Penicillins Other (See Comments)    UNKNOWN  . Soap Itching, Rash and Other (See Comments)    Can tolerate hypoallergenic soap    Chief Complaint  Patient presents with  . Acute Visit    Patient is much weaker. Increasing dyspnea and pulmonary congestion.    HPI:   Patient appears weaker. He is also increasingly pale. Hemoglobin has dropped down to 10.4 g percent.  BUN and creatinine are still quite high although perhaps slightly improved since the last days.  Acute on chronic systolic heart failure: BNP continues to run high at 53.6. Chest x-ray on 11/12/13 did not disclose any further changes. Radiologists and there was no evidence for acute disease.  Cellulitis of forearm: Improved  CKD (chronic kidney disease) stage 4, GFR 15-29 ml/min: Very high BUN and creatinine  Depression: Patient's son feels like his father may have "given up"  Type 2 diabetes mellitus with hyperglycemia: Patient had episodes of hyperglycemia to greater than 500 mg percent. This seems better controlled at the present time.   Anemia due to chronic blood loss: Hemoglobin is falling    Medications: Patient's Medications  New Prescriptions   No medications on file  Previous Medications   ASPIRIN 81 MG TABLET    Take 81 mg by mouth daily.   CALCIUM CARBONATE (OS-CAL - DOSED IN MG OF ELEMENTAL CALCIUM) 1250 MG TABLET    Take 1 tablet by mouth.   CETAPHIL (CETAPHIL) CREAM    Apply topically daily.   DEXLANSOPRAZOLE 30 MG CAPSULE    Take 30 mg by mouth daily.   FERROUS SULFATE 325 (65 FE) MG TABLET    Take 325 mg by mouth 2 (two) times daily with a meal.   FLUTICASONE-SALMETEROL (ADVAIR) 100-50 MCG/DOSE AEPB    Inhale 1 puff into the lungs 2 (two) times daily.   FUROSEMIDE (LASIX) 40 MG  TABLET    Take 80 mg by mouth 2 (two) times daily.    GLIMEPIRIDE (AMARYL) 2 MG TABLET    Take 2 mg by mouth daily with breakfast.   HYDROCODONE-ACETAMINOPHEN (NORCO) 7.5-325 MG PER TABLET    Take 1 tablet by mouth every 6 (six) hours as needed for moderate pain.   ISOSORBIDE-HYDRALAZINE (BIDIL) 20-37.5 MG PER TABLET    Take 1 tablet by mouth 2 (two) times daily.    MELATONIN 5 MG TABS    Take 5 mg by mouth at bedtime.   METOPROLOL SUCCINATE (TOPROL-XL) 25 MG 24 HR TABLET    Take 25 mg by mouth daily.   MIRTAZAPINE (REMERON) 7.5 MG TABLET    Take 7.5 mg by mouth at bedtime.   NAMENDA 5 MG TABLET    Take 5 mg by mouth 2 (two) times daily.    NITROGLYCERIN (NITROSTAT) 0.4 MG SL TABLET    Place 0.4 mg under the tongue every 5 (five) minutes as needed.   OXYCODONE-ACETAMINOPHEN (PERCOCET/ROXICET) 5-325 MG PER TABLET    Take 1 tablet by mouth every 6 (six) hours as needed for severe pain.   POTASSIUM CHLORIDE SA (K-DUR,KLOR-CON) 20 MEQ TABLET    Take 2 tablets (40 mEq total) by mouth daily.   RIVASTIGMINE (EXELON) 9.5 MG/24HR    Place 9.5 mg onto the skin daily.  SERTRALINE (ZOLOFT) 100 MG TABLET    Take 100 mg by mouth daily.   SITAGLIPTIN (JANUVIA) 50 MG TABLET    Take 50 mg by mouth daily.  Modified Medications   No medications on file  Discontinued Medications   No medications on file     Review of Systems  Constitutional: Positive for activity change, appetite change and fatigue. Negative for fever and unexpected weight change.  HENT: Positive for hearing loss. Negative for congestion, ear pain, rhinorrhea, sore throat, tinnitus, trouble swallowing and voice change.   Eyes: Positive for visual disturbance (Corrective lenses).       Corrective lenses  Respiratory: Positive for shortness of breath and wheezing. Negative for choking and chest tightness.        Course, rattling breath sounds. Bronchial noises. Coughing. History of COPD, bronchiectasis, and wheezing.  Cardiovascular: Negative  for chest pain and palpitations. Leg swelling: 1+ bipedal edema.       History of pacemaker. History of atherosclerosis of the aorta. History of cardiomegaly.  Gastrointestinal: Negative for nausea, abdominal pain, diarrhea, constipation and abdominal distention.       History of GERD and esophageal stenosis requiring dilation.  Endocrine: Negative for cold intolerance, heat intolerance, polydipsia, polyphagia and polyuria.       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 Diabetes is under poor control. Several days ago he was hypoglycemic at times, then he became significantly hyperglycemic. He now is running blood sugars less than 250 all of the time. There is no further hypoglycemia.  Genitourinary: Negative for dysuria, urgency, frequency and testicular pain.       History of prostate cancer. Incontinent. Chronic kidney disease, stage IV  Musculoskeletal: Positive for arthralgias, back pain and gait problem. Negative for myalgias and neck pain.       History of multiple rib fractures bilaterally  Skin: Positive for pallor. Negative for color change and rash.       Improvement in the previous cellulitis at the left antecubital fossa. Erythema has diminished and tenderness is improved.  Allergic/Immunologic: Negative.   Neurological: Negative for dizziness, tremors, syncope, speech difficulty, weakness, numbness and headaches.  Hematological: Negative for adenopathy. Does not bruise/bleed easily.       History of anemia  Psychiatric/Behavioral: Positive for confusion and dysphoric mood. Negative for hallucinations, behavioral problems, sleep disturbance and decreased concentration. The patient is not nervous/anxious.        Depressed. Withdrawn. Sad expression. Responds to very little.    Filed Vitals:   11/19/13 1846  BP: 140/70  Pulse: 75  Temp: 97.5 F (36.4 C)  Resp: 18  Weight: 165 lb 3.2 oz (74.934 kg)  SpO2: 95%   Body mass index is 24.38  kg/(m^2).  Physical Exam   Labs reviewed: Nursing Home on 11/17/2013  Component Date Value Ref Range Status  . Hemoglobin 11/12/2013 12.3* 13.5 - 17.5 g/dL Final  . Glucose 11/12/2013 508   Final  . BUN 11/12/2013 125* 4 - 21 mg/dL Final  . Creatinine 11/12/2013 4.1* 0.6 - 1.3 mg/dL Final  . Hemoglobin 11/15/2013 10.4* 13.5 - 17.5 g/dL Final  . Glucose 11/15/2013 186   Final  . BUN 11/15/2013 127* 4 - 21 mg/dL Final  . Creatinine 11/15/2013 3.6* 0.6 - 1.3 mg/dL Final  Admission on 11/04/2013, Discharged on 11/08/2013  Component Date Value Ref Range Status  . Pro B Natriuretic peptide (BNP) 11/04/2013 16031.0* 0 - 450 pg/mL Final  . Troponin i, poc 11/04/2013 0.15* 0.00 -  0.08 ng/mL Final  . Comment 11/04/2013 NOTIFIED PHYSICIAN   Final  . Comment 3 11/04/2013          Final   Comment: Due to the release kinetics of cTnI,                          a negative result within the first hours                          of the onset of symptoms does not rule out                          myocardial infarction with certainty.                          If myocardial infarction is still suspected,                          repeat the test at appropriate intervals.  . WBC 11/04/2013 8.4  4.0 - 10.5 K/uL Final  . RBC 11/04/2013 3.62* 4.22 - 5.81 MIL/uL Final  . Hemoglobin 11/04/2013 9.8* 13.0 - 17.0 g/dL Final  . HCT 11/04/2013 32.1* 39.0 - 52.0 % Final  . MCV 11/04/2013 88.7  78.0 - 100.0 fL Final  . MCH 11/04/2013 27.1  26.0 - 34.0 pg Final  . MCHC 11/04/2013 30.5  30.0 - 36.0 g/dL Final  . RDW 11/04/2013 16.0* 11.5 - 15.5 % Final  . Platelets 11/04/2013 193  150 - 400 K/uL Final  . Neutrophils Relative % 11/04/2013 82* 43 - 77 % Final  . Neutro Abs 11/04/2013 6.9  1.7 - 7.7 K/uL Final  . Lymphocytes Relative 11/04/2013 6* 12 - 46 % Final  . Lymphs Abs 11/04/2013 0.5* 0.7 - 4.0 K/uL Final  . Monocytes Relative 11/04/2013 11  3 - 12 % Final  . Monocytes Absolute 11/04/2013 0.9  0.1 - 1.0  K/uL Final  . Eosinophils Relative 11/04/2013 1  0 - 5 % Final  . Eosinophils Absolute 11/04/2013 0.1  0.0 - 0.7 K/uL Final  . Basophils Relative 11/04/2013 0  0 - 1 % Final  . Basophils Absolute 11/04/2013 0.0  0.0 - 0.1 K/uL Final  . Sodium 11/04/2013 141  137 - 147 mEq/L Final  . Potassium 11/04/2013 3.5* 3.7 - 5.3 mEq/L Final  . Chloride 11/04/2013 99  96 - 112 mEq/L Final  . CO2 11/04/2013 24  19 - 32 mEq/L Final  . Glucose, Bld 11/04/2013 120* 70 - 99 mg/dL Final  . BUN 11/04/2013 118* 6 - 23 mg/dL Final  . Creatinine, Ser 11/04/2013 3.27* 0.50 - 1.35 mg/dL Final  . Calcium 11/04/2013 8.6  8.4 - 10.5 mg/dL Final  . GFR calc non Af Amer 11/04/2013 16* >90 mL/min Final  . GFR calc Af Amer 11/04/2013 18* >90 mL/min Final   Comment: (NOTE)                          The eGFR has been calculated using the CKD EPI equation.                          This calculation has not been validated in all clinical situations.  eGFR's persistently <90 mL/min signify possible Chronic Kidney                          Disease.  . Anion gap 11/04/2013 18* 5 - 15 Final  . Troponin I 11/04/2013 <0.30  <0.30 ng/mL Final   Comment:                                 Due to the release kinetics of cTnI,                          a negative result within the first hours                          of the onset of symptoms does not rule out                          myocardial infarction with certainty.                          If myocardial infarction is still suspected,                          repeat the test at appropriate intervals.  . Hemoglobin A1C 11/05/2013 8.4* <5.7 % Final   Comment: (NOTE)                                                                                                                         According to the ADA Clinical Practice Recommendations for 2011, when                          HbA1c is used as a screening test:                           >=6.5%   Diagnostic  of Diabetes Mellitus                                    (if abnormal result is confirmed)                          5.7-6.4%   Increased risk of developing Diabetes Mellitus                          References:Diagnosis and Classification of Diabetes Mellitus,Diabetes                          KGUR,4270,62(BJSEG 1):S62-S69 and Standards of Medical Care in  Diabetes - 2011,Diabetes Care,2011,34 (Suppl 1):S11-S61.  . Mean Plasma Glucose 11/05/2013 194* <117 mg/dL Final   Performed at [facility]  . Glucose-Capillary 11/05/2013 130* 70 - 99 mg/dL Final  . Glucose-Capillary 11/05/2013 95  70 - 99 mg/dL Final  . Glucose-Capillary 11/05/2013 109* 70 - 99 mg/dL Final  . Comment 1 11/05/2013 Documented in Chart   Final  . Comment 2 11/05/2013 Notify RN   Final  . Glucose-Capillary 11/05/2013 138* 70 - 99 mg/dL Final  . Comment 1 11/05/2013 Documented in Chart   Final  . Comment 2 11/05/2013 Notify RN   Final  . Sodium 11/06/2013 141  137 - 147 mEq/L Final  . Potassium 11/06/2013 3.0* 3.7 - 5.3 mEq/L Final  . Chloride 11/06/2013 96  96 - 112 mEq/L Final  . CO2 11/06/2013 27  19 - 32 mEq/L Final  . Glucose, Bld 11/06/2013 94  70 - 99 mg/dL Final  . BUN 11/06/2013 112* 6 - 23 mg/dL Final  . Creatinine, Ser 11/06/2013 3.25* 0.50 - 1.35 mg/dL Final  . Calcium 11/06/2013 8.8  8.4 - 10.5 mg/dL Final  . GFR calc non Af Amer 11/06/2013 16* >90 mL/min Final  . GFR calc Af Amer 11/06/2013 18* >90 mL/min Final   Comment: (NOTE)                          The eGFR has been calculated using the CKD EPI equation.                          This calculation has not been validated in all clinical situations.                          eGFR's persistently <90 mL/min signify possible Chronic Kidney                          Disease.  . Anion gap 11/06/2013 18* 5 - 15 Final  . Glucose-Capillary 11/05/2013 109* 70 - 99 mg/dL Final  . Glucose-Capillary 11/06/2013 94  70 - 99 mg/dL  Final  . Glucose-Capillary 11/06/2013 144* 70 - 99 mg/dL Final  . Comment 1 11/06/2013 Notify RN   Final  . Sodium 11/07/2013 144  137 - 147 mEq/L Final  . Potassium 11/07/2013 4.0  3.7 - 5.3 mEq/L Final   DELTA CHECK NOTED  . Chloride 11/07/2013 99  96 - 112 mEq/L Final  . CO2 11/07/2013 28  19 - 32 mEq/L Final  . Glucose, Bld 11/07/2013 109* 70 - 99 mg/dL Final  . BUN 11/07/2013 116* 6 - 23 mg/dL Final  . Creatinine, Ser 11/07/2013 3.34* 0.50 - 1.35 mg/dL Final  . Calcium 11/07/2013 8.9  8.4 - 10.5 mg/dL Final  . GFR calc non Af Amer 11/07/2013 15* >90 mL/min Final  . GFR calc Af Amer 11/07/2013 18* >90 mL/min Final   Comment: (NOTE)                          The eGFR has been calculated using the CKD EPI equation.                          This calculation has not been validated in all clinical situations.  eGFR's persistently <90 mL/min signify possible Chronic Kidney                          Disease.  . Anion gap 11/07/2013 17* 5 - 15 Final  . Glucose-Capillary 11/06/2013 153* 70 - 99 mg/dL Final  . Comment 1 11/06/2013 Notify RN   Final  . Glucose-Capillary 11/06/2013 171* 70 - 99 mg/dL Final  . Comment 1 11/06/2013 Documented in Chart   Final  . Comment 2 11/06/2013 Notify RN   Final  . Glucose-Capillary 11/07/2013 130* 70 - 99 mg/dL Final  . Comment 1 11/07/2013 Documented in Chart   Final  . Comment 2 11/07/2013 Notify RN   Final  . Glucose-Capillary 11/07/2013 186* 70 - 99 mg/dL Final  . Comment 1 11/07/2013 Documented in Chart   Final  . Glucose-Capillary 11/07/2013 146* 70 - 99 mg/dL Final  . Comment 1 11/07/2013 Notify RN   Final  . Sodium 11/08/2013 141  137 - 147 mEq/L Final  . Potassium 11/08/2013 4.2  3.7 - 5.3 mEq/L Final  . Chloride 11/08/2013 97  96 - 112 mEq/L Final  . CO2 11/08/2013 30  19 - 32 mEq/L Final  . Glucose, Bld 11/08/2013 106* 70 - 99 mg/dL Final  . BUN 11/08/2013 114* 6 - 23 mg/dL Final  . Creatinine, Ser 11/08/2013 3.40*  0.50 - 1.35 mg/dL Final  . Calcium 11/08/2013 9.1  8.4 - 10.5 mg/dL Final  . GFR calc non Af Amer 11/08/2013 15* >90 mL/min Final  . GFR calc Af Amer 11/08/2013 17* >90 mL/min Final   Comment: (NOTE)                          The eGFR has been calculated using the CKD EPI equation.                          This calculation has not been validated in all clinical situations.                          eGFR's persistently <90 mL/min signify possible Chronic Kidney                          Disease.  . Anion gap 11/08/2013 14  5 - 15 Final  . Glucose-Capillary 11/07/2013 170* 70 - 99 mg/dL Final  . Glucose-Capillary 11/08/2013 157* 70 - 99 mg/dL Final  . Glucose-Capillary 11/08/2013 168* 70 - 99 mg/dL Final  . Comment 1 11/08/2013 Notify RN   Final  Admission on 10/13/2013, Discharged on 10/13/2013  Component Date Value Ref Range Status  . Color, Urine 10/13/2013 YELLOW  YELLOW Final  . APPearance 10/13/2013 CLEAR  CLEAR Final  . Specific Gravity, Urine 10/13/2013 1.016  1.005 - 1.030 Final  . pH 10/13/2013 5.0  5.0 - 8.0 Final  . Glucose, UA 10/13/2013 NEGATIVE  NEGATIVE mg/dL Final  . Hgb urine dipstick 10/13/2013 TRACE* NEGATIVE Final  . Bilirubin Urine 10/13/2013 NEGATIVE  NEGATIVE Final  . Ketones, ur 10/13/2013 NEGATIVE  NEGATIVE mg/dL Final  . Protein, ur 10/13/2013 NEGATIVE  NEGATIVE mg/dL Final  . Urobilinogen, UA 10/13/2013 0.2  0.0 - 1.0 mg/dL Final  . Nitrite 10/13/2013 NEGATIVE  NEGATIVE Final  . Leukocytes, UA 10/13/2013 NEGATIVE  NEGATIVE Final  . Squamous Epithelial / LPF 10/13/2013 RARE  RARE Final  . WBC, UA 10/13/2013 0-2  <3 WBC/hpf Final  . RBC / HPF 10/13/2013 11-20  <3 RBC/hpf Final  Office Visit on 09/19/2013  Component Date Value Ref Range Status  . Date Time Interrogation Session 09/19/2013 [id number]   Final  . Pulse Generator Manufacturer 09/19/2013 [employer]   Final  . Pulse Gen Model 09/19/2013 [address]   Final  . Pulse Gen Serial Number  09/19/2013 [id number]   Final  . RV Sense Sensitivity 09/19/2013 2   Final  . RA Pace Amplitude 09/19/2013 2.5   Final  . RV Pace PulseWidth 09/19/2013 0.4   Final  . RV Pace Amplitude 09/19/2013 1   Final  . RA Impedance 09/19/2013 437.5   Final  . RA Amplitude 09/19/2013 0.6   Final  . RA Pacing Amplitude 09/19/2013 1   Final  . RA Pacing PulseWidth 09/19/2013 0.4   Final  . RA Pacing Amplitude 09/19/2013 1   Final  . RA Pacing PulseWidth 09/19/2013 0.4   Final  . RV IMPEDANCE 09/19/2013 412.5   Final  . RV Amplitude 09/19/2013 7.8   Final  . RV Pacing Amplitude 09/19/2013 0.75   Final  . RV Pacing PulseWidth 09/19/2013 0.4   Final  . Battery Status 09/19/2013 Unknown   Final  . Battery Longevity 09/19/2013 84   Final  . Battery Voltage 09/19/2013 2.92   Final  . Loletha Grayer RA Perc Paced 09/19/2013 93   Final  . Loletha Grayer RV Perc Paced 09/19/2013 99.63   Final  . Elizabeth Sauer Rhythm 09/19/2013 Brady @ 36   Final  . Miscellaneous Comment 09/19/2013    Final                   Value:Pacemaker check in clinic. Normal device function. Thresholds, sensing, impedances consistent with previous measurements. Device programmed to maximize longevity. 1 mode switch, 1 minute, - coumadin.  No high ventricular rates noted. Device programmed at                          appropriate safety margins. Histogram distribution appropriate for patient activity level. AV delays reprogrammed 200/115mec. Estimated longevity 7 years. Patient enrolled in remote follow-up/TTM's with [employer]. Plan to follow every 3 months remotely                          and see annually in office. Patient education completed.  Merlin 12/21/13.   11/12/13 BNP 992 11/15/13 BNP 563.6    Assessment/Plan Acute on chronic systolic heart failure: I think we're pushed his diuretics without as much as we can. He is on furosemide 80 mg daily as well as metolazone.  Cellulitis of forearm: Improved  CKD (chronic kidney disease) stage 4, GFR 15-29 ml/min:  Basically unchanged  Depression: I do not want to run the risk of having another medication such as antidepressant at this time. If he begins to rally, I would consider this.  Type 2 diabetes mellitus with hyperglycemia: Resume glimepiride 2 mg daily. Continue on insulin.  Anemia due to chronic blood loss: Hemoglobin has fallen   Chronic obstructive pulmonary disease, unspecified COPD, unspecified chronic bronchitis type - Plan: predniSONE (DELTASONE) 20 MG tablet  I will return to reassess patient next week.  I requested CBC, BMP, and BMP on 11/20/48.  Hoping that the prednisone will allow for some improvement in the increasing congestion he has.

## 2013-11-27 ENCOUNTER — Non-Acute Institutional Stay (SKILLED_NURSING_FACILITY): Payer: Medicare Other | Admitting: Nurse Practitioner

## 2013-11-27 DIAGNOSIS — R413 Other amnesia: Secondary | ICD-10-CM

## 2013-11-27 DIAGNOSIS — E1140 Type 2 diabetes mellitus with diabetic neuropathy, unspecified: Secondary | ICD-10-CM

## 2013-11-27 DIAGNOSIS — R627 Adult failure to thrive: Secondary | ICD-10-CM

## 2013-11-27 DIAGNOSIS — E875 Hyperkalemia: Secondary | ICD-10-CM

## 2013-11-27 NOTE — Progress Notes (Signed)
Patient ID: Joseph Hobbs, male   DOB: May 23, 1924, 80 y.o.   MRN: [id number]    Nursing Home Location:  [facility]   Place of Service: SNF (31)  PCP: Ginette Otto, MD  Allergies  Allergen Reactions  . Penicillins Other (See Comments)    UNKNOWN  . Soap Itching, Rash and Other (See Comments)    Can tolerate hypoallergenic soap    Chief Complaint  Patient presents with  . Acute Visit    elevated blood sugars, medication question    HPI:  Patient is a 80 y.o. male seen today at [facility] for acute concerns per nursing. Pt with elevated blood sugars in the 300-400s. Pt was started on prednisone and blood sugars became more elevated after that. Pt was on glimepiride and januvia and then started on novolog 5 mg units with meals if blood sugars over 150.   Staff reports minimal PO intake. 1-25% of meals consumed. Pt not even meeting his fluid restriction drinking only [phone] cc/day.  Staff reports he has not been himself. More depressed and withdrawn. Memory worse. Staff notes pt was previously on 13.3mg  patch but has been on 9.5 mg since hospitalization due to CHF.   Review of Systems:  Review of Systems  Constitutional: Positive for activity change, appetite change and fatigue. Negative for fever and unexpected weight change.  HENT: Positive for hearing loss. Negative for congestion, ear pain, rhinorrhea, sore throat, tinnitus, trouble swallowing and voice change.   Eyes: Positive for visual disturbance (Corrective lenses).       Corrective lenses  Respiratory: Positive for shortness of breath and wheezing. Negative for choking and chest tightness.        History of COPD, bronchiectasis, and wheezing.  Cardiovascular: Negative for chest pain and palpitations.       History of pacemaker. History of atherosclerosis of the aorta. History of cardiomegaly.  Gastrointestinal: Negative for nausea, abdominal pain, diarrhea, constipation and  abdominal distention.       History of GERD and esophageal stenosis requiring dilation.  Endocrine: Negative for cold intolerance, heat intolerance, polydipsia, polyphagia and polyuria.       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 Diabetes is under poor control.  Genitourinary: Negative for dysuria, urgency, frequency and testicular pain.       History of prostate cancer. Incontinent. Chronic kidney disease, stage IV  Musculoskeletal: Positive for arthralgias, back pain and gait problem. Negative for myalgias and neck pain.       History of multiple rib fractures bilaterally  Skin: Positive for pallor. Negative for color change and rash.  Allergic/Immunologic: Negative.   Neurological: Negative for dizziness, tremors, syncope, speech difficulty, weakness, numbness and headaches.  Hematological: Negative for adenopathy. Does not bruise/bleed easily.       History of anemia  Psychiatric/Behavioral: Positive for confusion and dysphoric mood. Negative for hallucinations, behavioral problems, sleep disturbance and decreased concentration. The patient is not nervous/anxious.        Depressed. Withdrawn. Sad expression. Responds to very little.    Past Medical History  Diagnosis Date  . Hypertension   . History of acute myocardial infarction 2000    S/P STENTS X3  . S/P coronary artery stent placement   . History of CHF (congestive heart failure)     POST SURGERY 2011. EF 35-40%  . PAF (paroxysmal atrial fibrillation)     POST OP SURGERY 2011  . Hyperlipidemia   . Gait disorder   . Diabetic peripheral  neuropathy   . GERD (gastroesophageal reflux disease)   . History of esophageal stricture     POST DILATION'S  . Urethral stricture   . Urinary incontinence   . Chronic kidney disease (CKD), stage III (moderate)   . History of prostate cancer 1995    S/P PROSTATECTOMY  . Arthritis   . Impaired hearing   . Dry skin     AT TIMES GETS LESIONS-  . Chronic  back pain   . Asymptomatic carotid artery stenosis     BILATERAL ICA   -- PER DR SMITH NOTE   . Pacemaker 2000  . Coronary artery disease     CARDIOLOGIST- DR Verdis PrimeHENRY SMITH-- LOV  06-08-2011--  CURRENT EKG, LAST PACER CHECK , ECHO W/ CHART (NO STRESS TEST )  . Alzheimer's disease 2010    MMSE 20/30. [place] in [place]  . Systolic heart failure   . Acute on chronic systolic HF (heart failure) 11/04/13  . Type 2 diabetes mellitus with diabetic neuropathy   . CKD (chronic kidney disease) stage 4, GFR 15-29 ml/min 2013  . Dyspnea   . Anemia in chronic kidney disease 2015  . COPD (chronic obstructive pulmonary disease)   . Bronchiectasis   . Cardiomegaly   . Aortic atherosclerosis 2015  . Multiple fractures of ribs of both sides 2008  . Depression   . Pacemaker   . Cellulitis of forearm October 2015  . Hoarse 2015  . Ischemic cardiomyopathy   . Ankylosing spondylitis August 2011  . Carpal tunnel syndrome 2012    Severe on the right side, moderate on the left but nerve conduction velocities  . 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 Prostatectomy  1995    PROSTATE CANCER  . Lumbar disc surgery  X3   LAST ONE 1990  . Coronary angioplasty with stent placement  2000    X3 [place]  [place]  . Neuroplasty / transposition median nerve at carpal tunnel bilateral    . Pilonidal cyst excision    . Tonsillectomy and adenoidectomy    . Partial colectomy/ repair fissure  2011    DIVERTICULITIS  . Transthoracic echocardiogram  01-03-2010      SEVERE CONCENTRIC LVH/ EF 35-40%/ SEVERE LEFT ATRIAL ENLARGEMENT/ MODERATE MITRAL VALVE REGURG./ GRADE IID DIASTOLIC DYSFUNCTION/ MILD AORTIC  &  TRICUSPID VALVE REGURG  . Cystoscopy with urethral dilatation  11/19/2011    Procedure: CYSTOSCOPY WITH URETHRAL DILATATION;  Surgeon: Martina SinnerScott A MacDiarmid, MD;  [facility];  Service: Urology;  Laterality: N/A;  CYSTO WITH BALLOON DILATION   Social History:   reports that he has never smoked. He has never used smokeless tobacco. He reports that he does not drink alcohol or use illicit drugs.  Family History  Problem Relation Age of Onset  . Heart disease Mother     Medications: Patient's Medications  New Prescriptions   No medications on file  Previous Medications   ASPIRIN 81 MG TABLET    Take 81 mg by mouth daily.   CALCIUM CARBONATE (OS-CAL - DOSED IN MG OF ELEMENTAL CALCIUM) 1250 MG TABLET    Take 1 tablet by mouth.   CETAPHIL (CETAPHIL) CREAM    Apply topically daily.   DEXLANSOPRAZOLE 30 MG CAPSULE    Take 30 mg by mouth daily.   FERROUS SULFATE 325 (65 FE) MG TABLET  Take 325 mg by mouth 2 (two) times daily with a meal.   FLUTICASONE-SALMETEROL (ADVAIR) 100-50 MCG/DOSE AEPB    Inhale 1 puff into the lungs 2 (two) times daily.   FUROSEMIDE (LASIX) 40 MG TABLET    Take 80 mg by mouth 2 (two) times daily.    GLIMEPIRIDE (AMARYL) 2 MG TABLET    Take 4 mg by mouth daily with breakfast.    HYDROCODONE-ACETAMINOPHEN (NORCO) 7.5-325 MG PER TABLET    Take 1 tablet by mouth every 6 (six) hours as needed for moderate pain.   ISOSORBIDE-HYDRALAZINE (BIDIL) 20-37.5 MG PER TABLET    Take 1 tablet by mouth 2 (two) times daily.    MELATONIN 5 MG TABS    Take 5 mg by mouth at bedtime.   METOPROLOL SUCCINATE (TOPROL-XL) 25 MG 24 HR TABLET    Take 25 mg by mouth daily.   MIRTAZAPINE (REMERON) 7.5 MG TABLET    Take 7.5 mg by mouth at bedtime.   NAMENDA 5 MG TABLET    Take 5 mg by mouth 2 (two) times daily.    NITROGLYCERIN (NITROSTAT) 0.4 MG SL TABLET    Place 0.4 mg under the tongue every 5 (five) minutes as needed.   OXYCODONE-ACETAMINOPHEN (PERCOCET/ROXICET) 5-325 MG PER TABLET    Take 1 tablet by mouth every 6 (six) hours as needed for severe pain.   POTASSIUM CHLORIDE SA (K-DUR,KLOR-CON) 20 MEQ TABLET    Take 2 tablets (40 mEq total)  by mouth daily.   PREDNISONE (DELTASONE) 20 MG TABLET    1 tablet daily to help breathing.   RIVASTIGMINE (EXELON) 9.5 MG/24HR    Place 9.5 mg onto the skin daily.   SERTRALINE (ZOLOFT) 100 MG TABLET    Take 100 mg by mouth daily.   SITAGLIPTIN (JANUVIA) 50 MG TABLET    Take 50 mg by mouth daily.  Modified Medications   No medications on file  Discontinued Medications   No medications on file     Physical Exam: Filed Vitals:   11/27/13 1021  BP: 122/65  Pulse: 80  Temp: 96.5 F (35.8 C)  Resp: 18  Weight: 160 lb (72.576 kg)    Physical Exam  Constitutional: He is oriented to person, place, and time. He appears well-developed and well-nourished. No distress.  Elderly. Frail.  HENT:  Right Ear: External ear normal.  Left Ear: External ear normal.  Nose: Nose normal.  Mouth/Throat: Oropharynx is clear and moist. No oropharyngeal exudate.  Eyes: Conjunctivae and EOM are normal. Pupils are equal, round, and reactive to light.  Cardiovascular: Normal rate, regular rhythm and normal heart sounds.  Exam reveals no gallop and no friction rub.   No murmur heard. Pulmonary/Chest: Effort normal. No respiratory distress. He has wheezes. He has rales. He exhibits no tenderness.  Bronchial rhonchi; hoarseness when he speaks.  Abdominal: Soft. Bowel sounds are normal. He exhibits no distension. There is no tenderness.  Musculoskeletal: Normal range of motion. He exhibits no edema and no tenderness.  Neurological: He is alert and oriented to person, place, and time. He has normal reflexes. No cranial nerve deficit. Coordination normal.  Confused. Decreased memory.  Skin: No rash noted.  Psychiatric: He has a normal mood and affect.    Labs reviewed: Basic Metabolic Panel:  Recent Labs  16/11/9608/05/15 0312 11/07/13 0357 11/08/13 0503 11/12/13 11/15/13  NA 141 144 141  --   --   K 3.0* 4.0 4.2  --   --   CL  96 99 97  --   --   CO2 27 28 30   --   --   GLUCOSE 94 109* 106*  --   --     BUN 112* 116* 114* 125* 127*  CREATININE 3.25* 3.34* 3.40* 4.1* 3.6*  CALCIUM 8.8 8.9 9.1  --   --    Liver Function Tests: No results found for this basename: AST, ALT, ALKPHOS, BILITOT, PROT, ALBUMIN,  in the last 8760 hours No results found for this basename: LIPASE, AMYLASE,  in the last 8760 hours No results found for this basename: AMMONIA,  in the last 8760 hours CBC:  Recent Labs  11/04/13 2014 11/12/13 11/15/13  WBC 8.4  --   --   NEUTROABS 6.9  --   --   HGB 9.8* 12.3* 10.4*  HCT 32.1*  --   --   MCV 88.7  --   --   PLT 193  --   --    TSH: No results found for this basename: TSH,  in the last 8760 hours A1C: Lab Results  Component Value Date   HGBA1C 8.4* 11/05/2013    Assessment/Plan 1. Type 2 diabetes mellitus with diabetic neuropathy With elevation in blood sugars however pt is with very poor intake. Will cont current medications without adding additional medication at this time and monitor   2. Hyperkalemia -potassium 5.4 on last lab, repeat bmp  3. Memory loss -will increase patch back to 13.3 mg/day  4. Failure to thrive -decline since last hospitalization, with minimal PO intake including fluids, staff to encourage fluids up to 2000 cc fluid restrictions. Pt with shortness of breath when eating, staff working with dietary to get food that pt is able to eat more comfortably.

## 2014-01-02 DEATH — deceased

## 2014-05-01 IMAGING — CT CT NECK W/O CM
4 of 5 series · 16 of 33 positions shown, 19 images · non-contrast
Comparison: CT temporal bone of the same day.

CLINICAL DATA: Left postauricular squamous cell carcinoma.

EXAM:
CT NECK WITHOUT CONTRAST
TECHNIQUE: Multidetector CT imaging of the neck was performed following the
standard protocol without intravenous contrast.

[Series 3: axial neck · axial · 0.49mm/px · z∈[-16,+82]mm · 3 of 98 slices shown]
[im 20/98  bone]
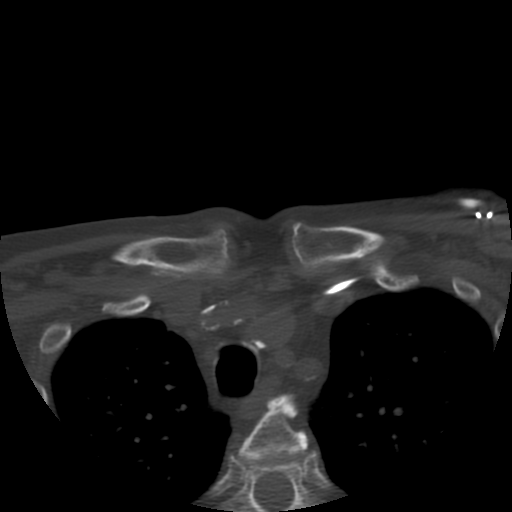
[im 39/98  bone]
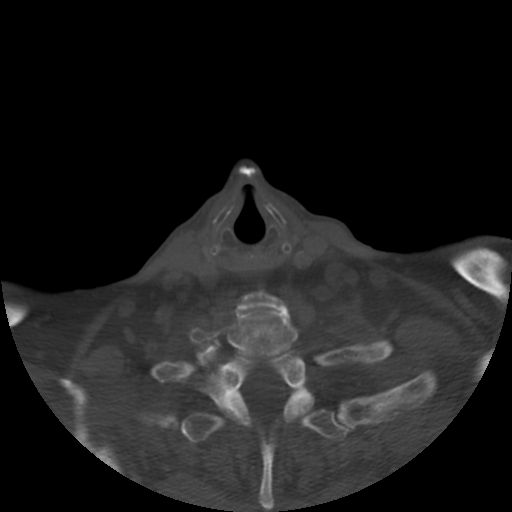
[im 59/98  bone]
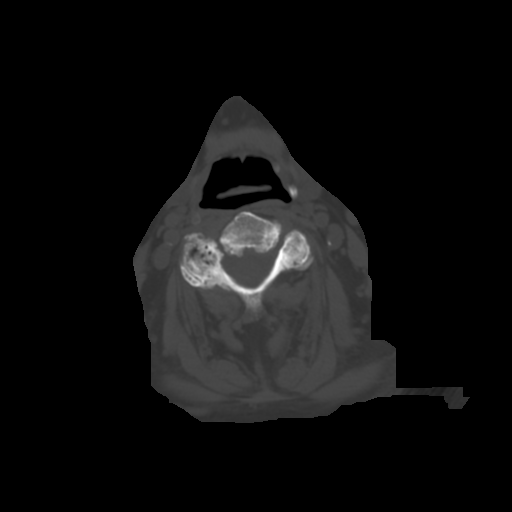

[Series 200: cor · coronal · 0.49mm/px · 3 of 110 slices shown]
[im 32/110  bone]
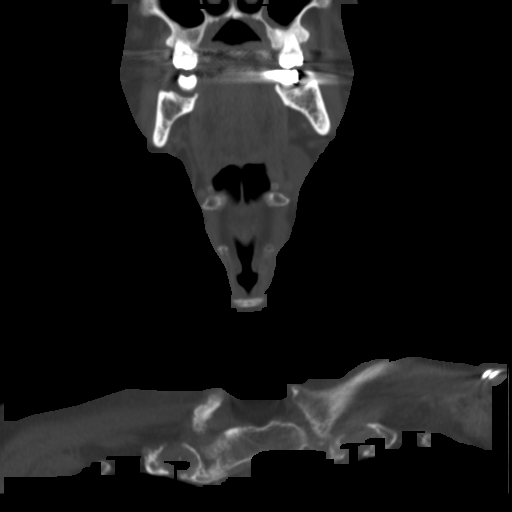
[im 48/110  bone]
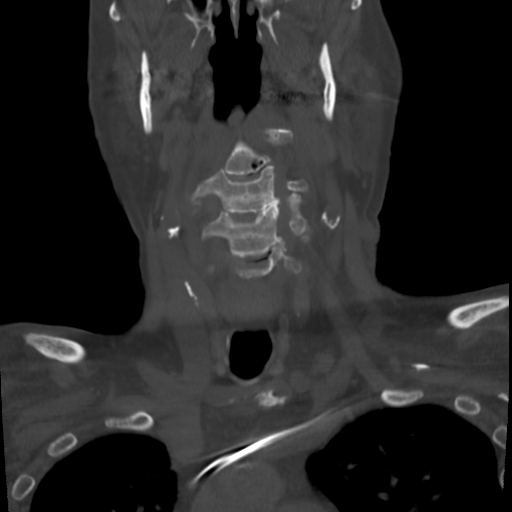
[im 64/110  bone]
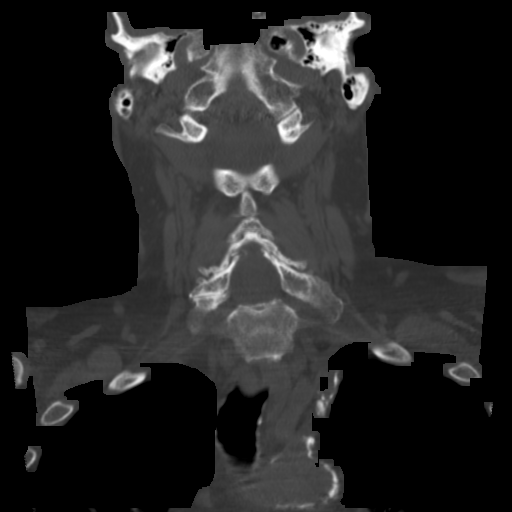

[Series 201: angled axial · axial · 0.49mm/px · z∈[-77,+65]mm · 5 of 125 slices shown, 7 images]
[im 21/125  soft-tissue]
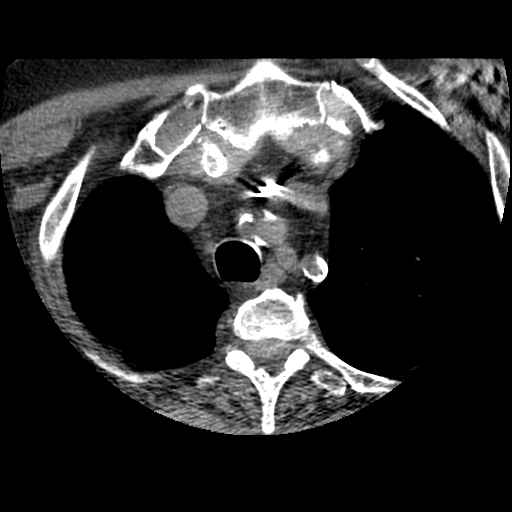
[im 21/125  bone]
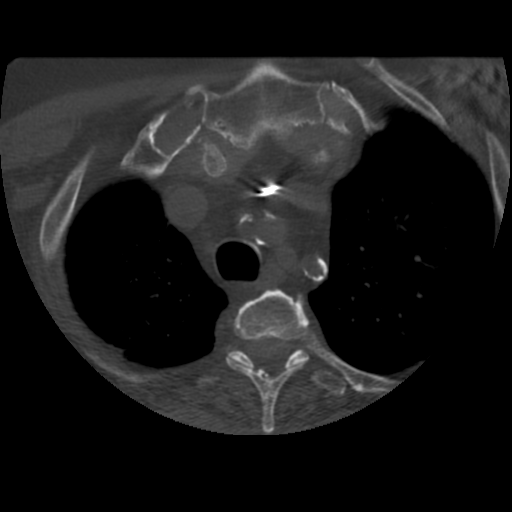
[im 42/125  bone]
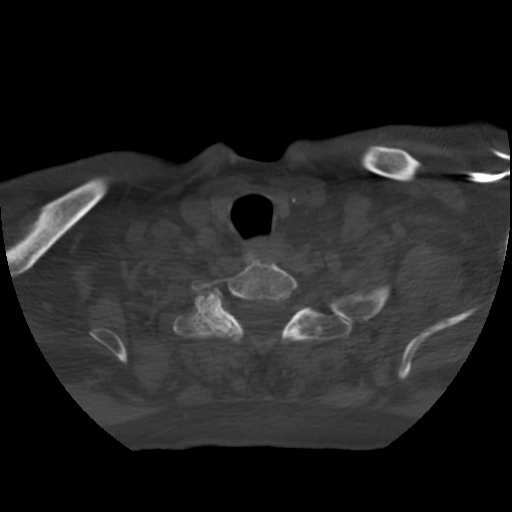
[im 63/125  bone]
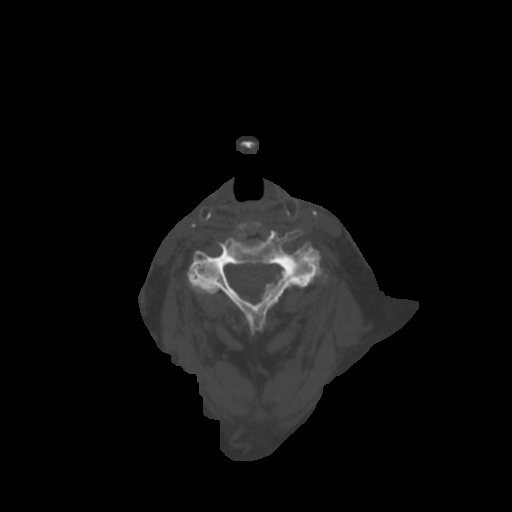
[im 83/125  bone]
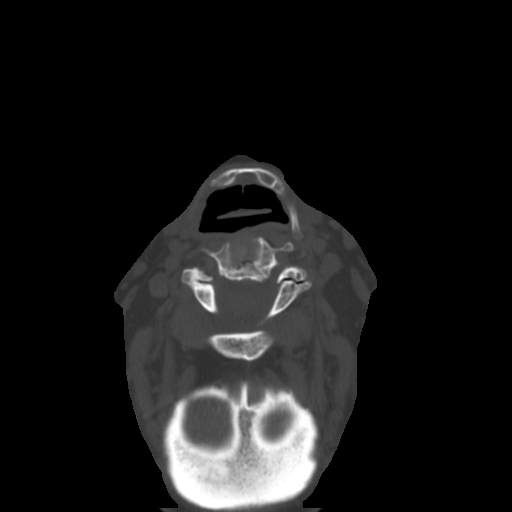
[im 104/125  soft-tissue]
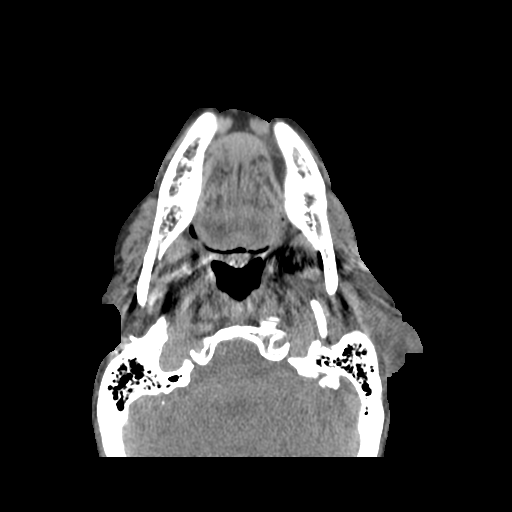
[im 104/125  bone]
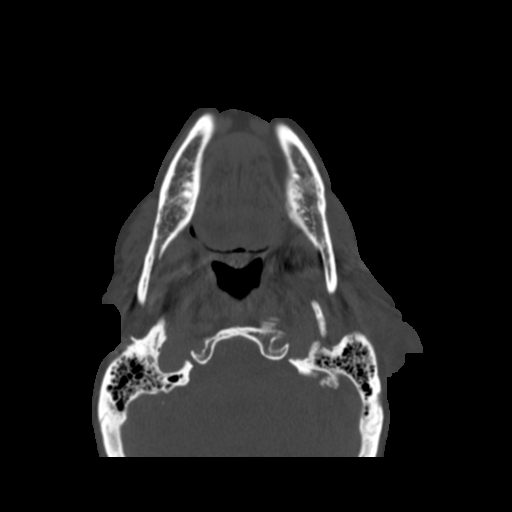

[Series 203: sag · sagittal · 0.49mm/px · 5 of 82 slices shown, 6 images]
[im 28/82  bone]
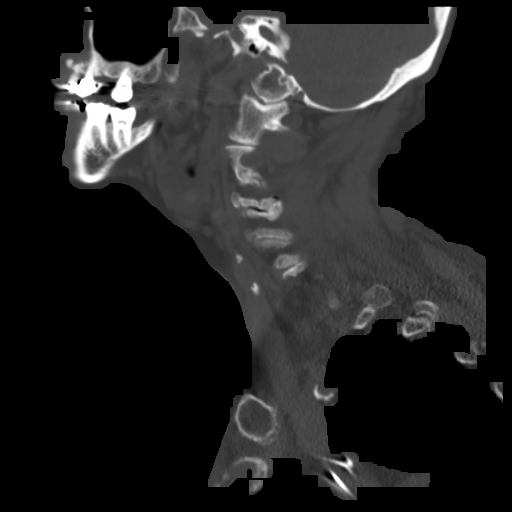
[im 34/82  bone]
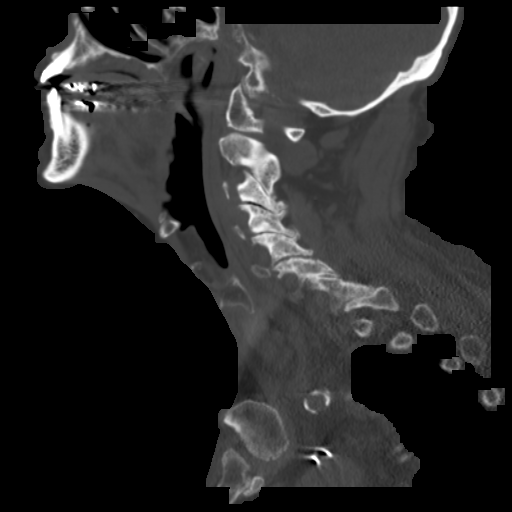
[im 41/82  soft-tissue]
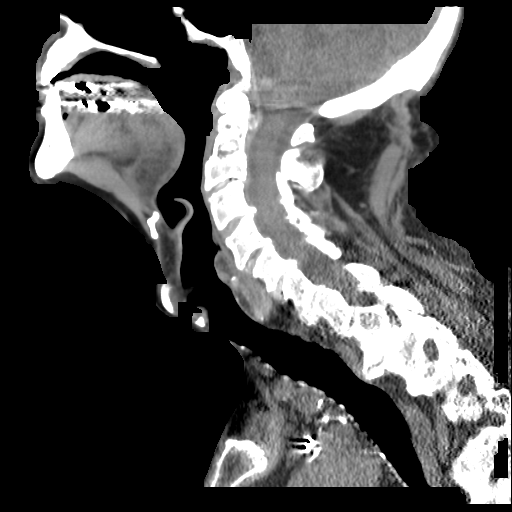
[im 41/82  bone]
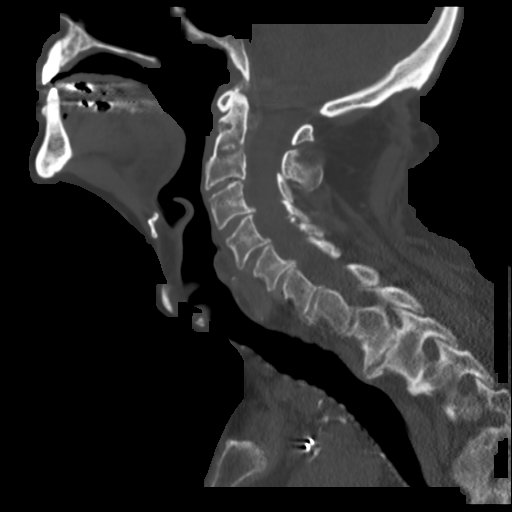
[im 48/82  bone]
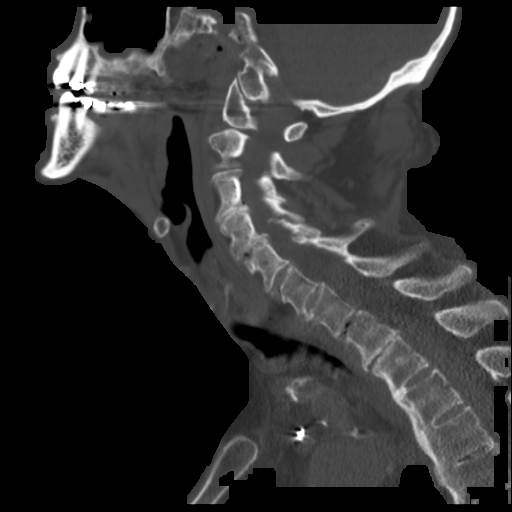
[im 55/82  bone]
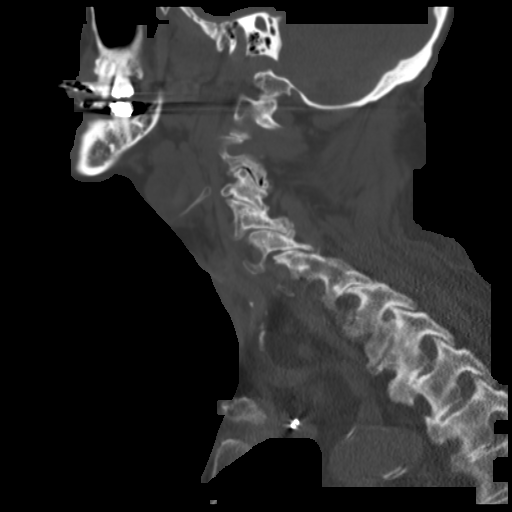

[16 of 33 positions shown; findings below may reference images not displayed]

FINDINGS: A soft tissue mass posterior to the left pinna measures 3.1 x 1.7 x
3.8 cm. The lesion abuts the superior aspect of the left parotid
gland without definite parotid invasion.

Small periparotid lymph nodes are slightly more prominent than on
the right. No pathologically enlarged nodes are present. Small level
1 nodes are not pathologically enlarged either.

Atherosclerotic calcifications are present at the carotid
bifurcations bilaterally. There is no significant cervical
adenopathy.

No significant mucosal or submucosal lesions are present.

Endplate osteophyte and facet hypertrophy is present throughout the
cervical spine. No focal lytic or blastic lesions are evident. There
is fusion of anterior osteophytes within the upper thoracic spine.

The lung apices are clear. Atherosclerotic calcifications are
present at the aortic arch. Pacemaker wires are in place.
IMPRESSION: 1. 3.1 x 1.7 x 3.8 cm left postauricular soft tissue lesion without
deep invasion.
2. The lesion abuts the posterior superior border of the left
parotid gland without parotid invasion.
3. Slight asymmetry of very small left periparotid and level 1 lymph
nodes without pathologic enlargement. No suspicious pathologic nodes
are evident.
4. Moderate spondylosis of the cervical spine.

## 2014-12-30 IMAGING — CR DG CHEST 2V
2 series · 2 of 2 positions shown · non-contrast
Comparison: 01/10/2010

CLINICAL DATA: Shortness of breath for the last week with weakness,
history of hypertension and diabetes

EXAM:
CHEST  2 VIEW

[w chest lat]
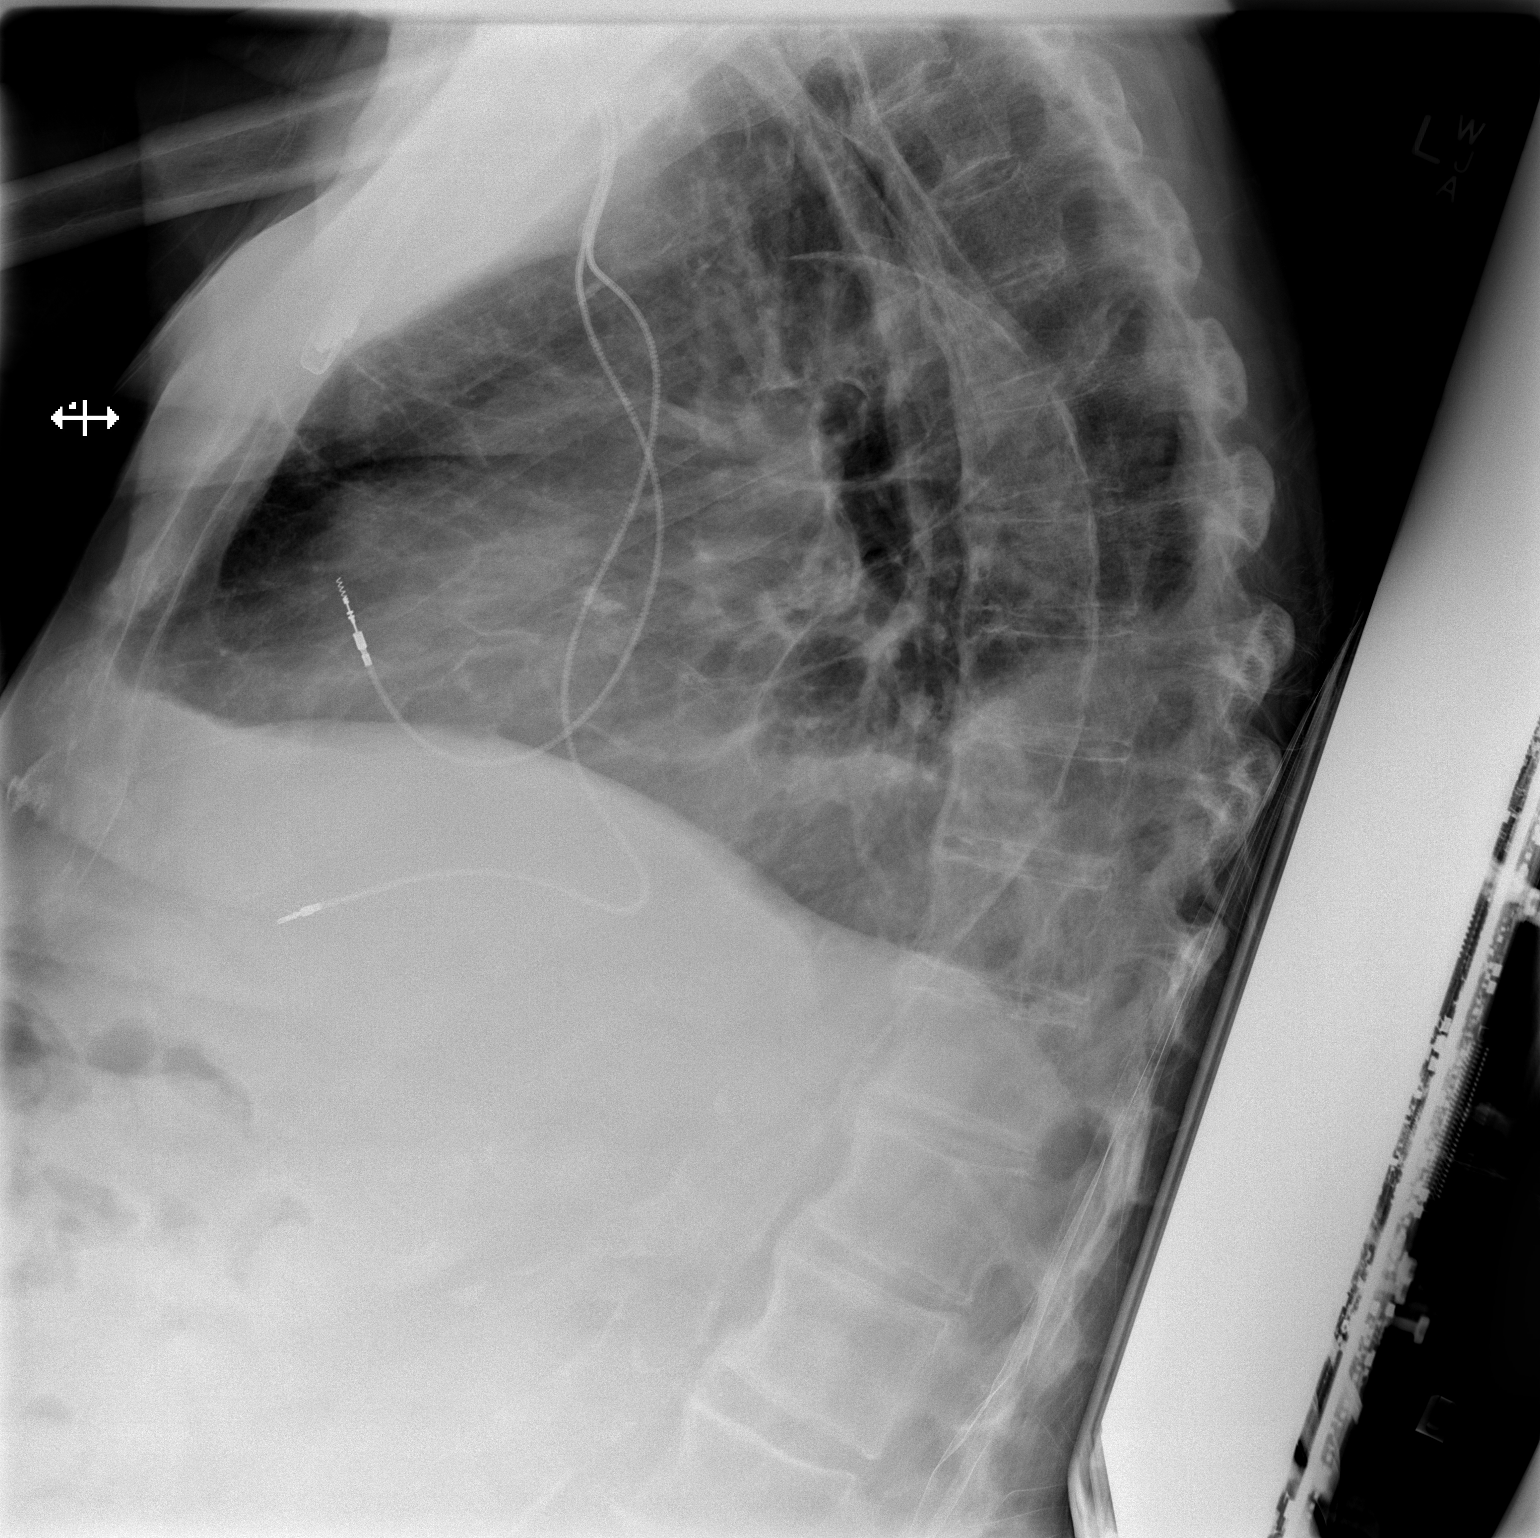

[x chest ap]
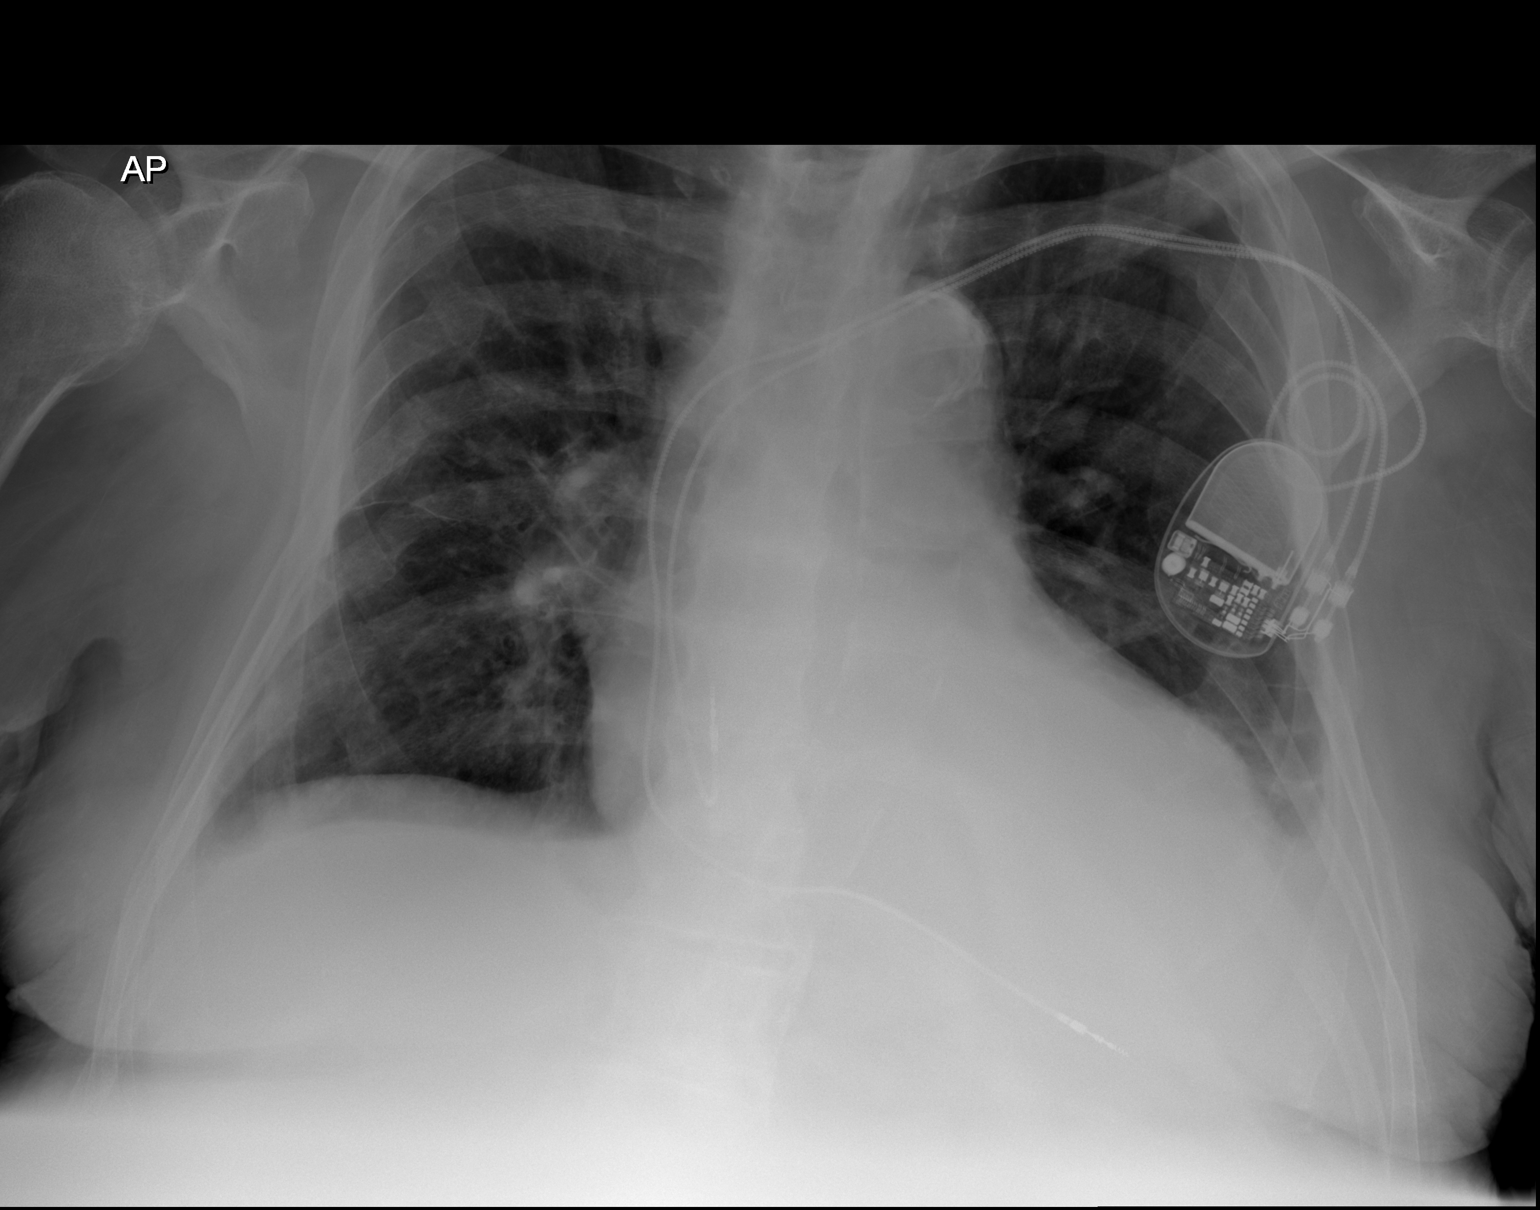

[2 of 2 positions shown; findings below may reference images not displayed]

FINDINGS: Mild cardiac enlargement. Extensive calcification of the aortic arch
and descending aorta. Two lead cardiac pacer stable.

Old right rib fractures.  Right lung is clear.

On the left, numerous old rib fractures also identified. There is
retrocardiac opacification with blunting of the left costophrenic
angle, both new when compared to the prior study.
IMPRESSION: Retrocardiac left lower lobe opacity represents a combination of
pleural effusion and consolidation. Pneumonia is not excluded.
